# Patient Record
Sex: Female | Born: 1945
Health system: Southern US, Community
[De-identification: ages and names within clinical notes are randomized; demographics above are authoritative.]

## PROBLEM LIST (undated history)

## (undated) DIAGNOSIS — Z7189 Other specified counseling: Secondary | ICD-10-CM

## (undated) DIAGNOSIS — E119 Type 2 diabetes mellitus without complications: Secondary | ICD-10-CM

## (undated) DIAGNOSIS — C50912 Malignant neoplasm of unspecified site of left female breast: Secondary | ICD-10-CM

## (undated) HISTORY — DX: Other specified counseling: Z71.89

## (undated) HISTORY — DX: Malignant neoplasm of unspecified site of left female breast: C50.912

## (undated) HISTORY — PX: EYE SURGERY: SHX253

---

## 2009-12-30 ENCOUNTER — Inpatient Hospital Stay (HOSPITAL_COMMUNITY): Admission: EM | Admit: 2009-12-30 | Discharge: 2010-01-06 | Payer: Self-pay | Admitting: Emergency Medicine

## 2011-02-16 LAB — GLUCOSE, CAPILLARY
Glucose-Capillary: 113 mg/dL — ABNORMAL HIGH (ref 70–99)
Glucose-Capillary: 132 mg/dL — ABNORMAL HIGH (ref 70–99)
Glucose-Capillary: 148 mg/dL — ABNORMAL HIGH (ref 70–99)
Glucose-Capillary: 209 mg/dL — ABNORMAL HIGH (ref 70–99)
Glucose-Capillary: 250 mg/dL — ABNORMAL HIGH (ref 70–99)
Glucose-Capillary: 253 mg/dL — ABNORMAL HIGH (ref 70–99)

## 2011-02-16 LAB — URINALYSIS, ROUTINE W REFLEX MICROSCOPIC
Nitrite: NEGATIVE
Specific Gravity, Urine: 1.011 (ref 1.005–1.030)
Urobilinogen, UA: 1 mg/dL (ref 0.0–1.0)

## 2011-02-16 LAB — BASIC METABOLIC PANEL
BUN: 51 mg/dL — ABNORMAL HIGH (ref 6–23)
Calcium: 7.8 mg/dL — ABNORMAL LOW (ref 8.4–10.5)
Creatinine, Ser: 2.55 mg/dL — ABNORMAL HIGH (ref 0.4–1.2)
GFR calc Af Amer: 23 mL/min — ABNORMAL LOW (ref >60)

## 2011-02-16 LAB — CBC
HCT: 33.1 % — ABNORMAL LOW (ref 36.0–46.0)
Hemoglobin: 10.7 g/dL — ABNORMAL LOW (ref 12.0–15.0)
MCHC: 32.3 g/dL (ref 30.0–36.0)
MCV: 86 fL (ref 78.0–100.0)
Platelets: 187 10*3/uL (ref 150–400)
RBC: 3.86 MIL/uL — ABNORMAL LOW (ref 3.87–5.11)
RBC: 4.36 MIL/uL (ref 3.87–5.11)
RDW: 14.4 % (ref 11.5–15.5)
WBC: 14.9 10*3/uL — ABNORMAL HIGH (ref 4.0–10.5)

## 2011-02-16 LAB — COMPREHENSIVE METABOLIC PANEL
ALT: 14 U/L (ref 0–35)
AST: 23 U/L (ref 0–37)
Alkaline Phosphatase: 130 U/L — ABNORMAL HIGH (ref 39–117)
CO2: 17 mEq/L — ABNORMAL LOW (ref 19–32)
Calcium: 8.3 mg/dL — ABNORMAL LOW (ref 8.4–10.5)
Chloride: 106 mEq/L (ref 96–112)
GFR calc non Af Amer: 17 mL/min — ABNORMAL LOW (ref >60)
Potassium: 3.8 mEq/L (ref 3.5–5.1)
Sodium: 132 mEq/L — ABNORMAL LOW (ref 135–145)

## 2011-02-16 LAB — DIFFERENTIAL
Eosinophils Absolute: 0 10*3/uL (ref 0.0–0.7)
Lymphocytes Relative: 5 % — ABNORMAL LOW (ref 12–46)
Neutro Abs: 13.8 10*3/uL — ABNORMAL HIGH (ref 1.7–7.7)
Neutrophils Relative %: 92 % — ABNORMAL HIGH (ref 43–77)

## 2011-02-16 LAB — APTT: aPTT: 27 seconds (ref 24–37)

## 2011-02-16 LAB — URINE CULTURE

## 2011-02-16 LAB — CULTURE, BLOOD (ROUTINE X 2)

## 2011-02-16 LAB — LIPASE, BLOOD: Lipase: 14 U/L (ref 11–59)

## 2011-02-19 LAB — CK TOTAL AND CKMB (NOT AT ARMC)
CK, MB: 3.2 ng/mL (ref 0.3–4.0)
Total CK: 54 U/L (ref 7–177)

## 2011-02-19 LAB — BASIC METABOLIC PANEL
BUN: 22 mg/dL (ref 6–23)
BUN: 25 mg/dL — ABNORMAL HIGH (ref 6–23)
BUN: 32 mg/dL — ABNORMAL HIGH (ref 6–23)
BUN: 38 mg/dL — ABNORMAL HIGH (ref 6–23)
CO2: 14 mEq/L — ABNORMAL LOW (ref 19–32)
CO2: 15 mEq/L — ABNORMAL LOW (ref 19–32)
CO2: 20 mEq/L (ref 19–32)
Calcium: 7.6 mg/dL — ABNORMAL LOW (ref 8.4–10.5)
Calcium: 7.9 mg/dL — ABNORMAL LOW (ref 8.4–10.5)
Calcium: 8 mg/dL — ABNORMAL LOW (ref 8.4–10.5)
Chloride: 115 mEq/L — ABNORMAL HIGH (ref 96–112)
Chloride: 118 mEq/L — ABNORMAL HIGH (ref 96–112)
Chloride: 122 mEq/L — ABNORMAL HIGH (ref 96–112)
Creatinine, Ser: 1.89 mg/dL — ABNORMAL HIGH (ref 0.4–1.2)
Creatinine, Ser: 1.94 mg/dL — ABNORMAL HIGH (ref 0.4–1.2)
Creatinine, Ser: 2.14 mg/dL — ABNORMAL HIGH (ref 0.4–1.2)
GFR calc Af Amer: 28 mL/min — ABNORMAL LOW (ref >60)
GFR calc Af Amer: 33 mL/min — ABNORMAL LOW (ref >60)
GFR calc Af Amer: 37 mL/min — ABNORMAL LOW (ref >60)
GFR calc non Af Amer: 23 mL/min — ABNORMAL LOW (ref >60)
GFR calc non Af Amer: 25 mL/min — ABNORMAL LOW (ref >60)
GFR calc non Af Amer: 31 mL/min — ABNORMAL LOW (ref >60)
Glucose, Bld: 124 mg/dL — ABNORMAL HIGH (ref 70–99)
Glucose, Bld: 173 mg/dL — ABNORMAL HIGH (ref 70–99)
Potassium: 3.7 mEq/L (ref 3.5–5.1)
Potassium: 3.9 mEq/L (ref 3.5–5.1)
Sodium: 139 mEq/L (ref 135–145)
Sodium: 140 mEq/L (ref 135–145)

## 2011-02-19 LAB — HEPATIC FUNCTION PANEL
AST: 31 U/L (ref 0–37)
Albumin: 2.1 g/dL — ABNORMAL LOW (ref 3.5–5.2)
Total Bilirubin: 0.4 mg/dL (ref 0.3–1.2)
Total Protein: 6 g/dL (ref 6.0–8.3)

## 2011-02-19 LAB — GLUCOSE, CAPILLARY
Glucose-Capillary: 120 mg/dL — ABNORMAL HIGH (ref 70–99)
Glucose-Capillary: 131 mg/dL — ABNORMAL HIGH (ref 70–99)
Glucose-Capillary: 132 mg/dL — ABNORMAL HIGH (ref 70–99)
Glucose-Capillary: 133 mg/dL — ABNORMAL HIGH (ref 70–99)
Glucose-Capillary: 145 mg/dL — ABNORMAL HIGH (ref 70–99)
Glucose-Capillary: 147 mg/dL — ABNORMAL HIGH (ref 70–99)
Glucose-Capillary: 148 mg/dL — ABNORMAL HIGH (ref 70–99)
Glucose-Capillary: 154 mg/dL — ABNORMAL HIGH (ref 70–99)
Glucose-Capillary: 154 mg/dL — ABNORMAL HIGH (ref 70–99)
Glucose-Capillary: 161 mg/dL — ABNORMAL HIGH (ref 70–99)
Glucose-Capillary: 166 mg/dL — ABNORMAL HIGH (ref 70–99)
Glucose-Capillary: 208 mg/dL — ABNORMAL HIGH (ref 70–99)

## 2011-02-19 LAB — CBC
HCT: 28.2 % — ABNORMAL LOW (ref 36.0–46.0)
HCT: 32.8 % — ABNORMAL LOW (ref 36.0–46.0)
Hemoglobin: 10.5 g/dL — ABNORMAL LOW (ref 12.0–15.0)
Hemoglobin: 9.1 g/dL — ABNORMAL LOW (ref 12.0–15.0)
MCHC: 32.3 g/dL (ref 30.0–36.0)
MCHC: 32.5 g/dL (ref 30.0–36.0)
MCV: 86 fL (ref 78.0–100.0)
MCV: 86.3 fL (ref 78.0–100.0)
Platelets: 163 10*3/uL (ref 150–400)
Platelets: 200 10*3/uL (ref 150–400)
RBC: 3.68 MIL/uL — ABNORMAL LOW (ref 3.87–5.11)
RBC: 3.8 MIL/uL — ABNORMAL LOW (ref 3.87–5.11)
RDW: 14.3 % (ref 11.5–15.5)
WBC: 10.4 10*3/uL (ref 4.0–10.5)
WBC: 21.3 10*3/uL — ABNORMAL HIGH (ref 4.0–10.5)
WBC: 8.6 10*3/uL (ref 4.0–10.5)

## 2011-02-19 LAB — MAGNESIUM: Magnesium: 1.1 mg/dL — ABNORMAL LOW (ref 1.5–2.5)

## 2011-11-21 ENCOUNTER — Other Ambulatory Visit: Payer: Self-pay | Admitting: Cardiology

## 2012-02-25 ENCOUNTER — Other Ambulatory Visit: Payer: Self-pay | Admitting: Cardiology

## 2012-07-26 ENCOUNTER — Other Ambulatory Visit: Payer: Self-pay | Admitting: Cardiology

## 2012-10-06 ENCOUNTER — Emergency Department (HOSPITAL_COMMUNITY)
Admission: EM | Admit: 2012-10-06 | Discharge: 2012-10-07 | Disposition: A | Discharge status: Home / Self Care | Payer: PRIVATE HEALTH INSURANCE | Attending: Emergency Medicine | Admitting: Emergency Medicine

## 2012-10-06 ENCOUNTER — Encounter (HOSPITAL_COMMUNITY): Payer: Self-pay | Admitting: Emergency Medicine

## 2012-10-06 DIAGNOSIS — E162 Hypoglycemia, unspecified: Secondary | ICD-10-CM

## 2012-10-06 DIAGNOSIS — E1169 Type 2 diabetes mellitus with other specified complication: Secondary | ICD-10-CM | POA: Insufficient documentation

## 2012-10-06 DIAGNOSIS — Z79899 Other long term (current) drug therapy: Secondary | ICD-10-CM | POA: Insufficient documentation

## 2012-10-06 HISTORY — DX: Type 2 diabetes mellitus without complications: E11.9

## 2012-10-06 LAB — CBC WITH DIFFERENTIAL/PLATELET
Basophils Absolute: 0 K/uL (ref 0.0–0.1)
Basophils Relative: 0 % (ref 0–1)
Eosinophils Absolute: 0 K/uL (ref 0.0–0.7)
Eosinophils Relative: 0 % (ref 0–5)
HCT: 35.2 % — ABNORMAL LOW (ref 36.0–46.0)
Hemoglobin: 10.9 g/dL — ABNORMAL LOW (ref 12.0–15.0)
Lymphocytes Relative: 15 % (ref 12–46)
Lymphs Abs: 1.7 K/uL (ref 0.7–4.0)
MCH: 26.3 pg (ref 26.0–34.0)
MCHC: 31 g/dL (ref 30.0–36.0)
MCV: 85 fL (ref 78.0–100.0)
Monocytes Absolute: 0.7 K/uL (ref 0.1–1.0)
Monocytes Relative: 6 % (ref 3–12)
Neutro Abs: 8.4 K/uL — ABNORMAL HIGH (ref 1.7–7.7)
Neutrophils Relative %: 78 % — ABNORMAL HIGH (ref 43–77)
Platelets: 320 K/uL (ref 150–400)
RBC: 4.14 MIL/uL (ref 3.87–5.11)
RDW: 14.4 % (ref 11.5–15.5)
WBC: 10.7 K/uL — ABNORMAL HIGH (ref 4.0–10.5)

## 2012-10-06 LAB — BASIC METABOLIC PANEL
CO2: 17 mEq/L — ABNORMAL LOW (ref 19–32)
Calcium: 9.1 mg/dL (ref 8.4–10.5)
Creatinine, Ser: 2.7 mg/dL — ABNORMAL HIGH (ref 0.50–1.10)
Glucose, Bld: 69 mg/dL — ABNORMAL LOW (ref 70–99)
Sodium: 138 mEq/L (ref 135–145)

## 2012-10-06 LAB — URINALYSIS, ROUTINE W REFLEX MICROSCOPIC
Glucose, UA: NEGATIVE mg/dL
Protein, ur: 30 mg/dL — AB

## 2012-10-06 LAB — GLUCOSE, CAPILLARY
Glucose-Capillary: 87 mg/dL (ref 70–99)
Glucose-Capillary: 131 mg/dL — ABNORMAL HIGH (ref 70–99)

## 2012-10-06 LAB — URINE MICROSCOPIC-ADD ON

## 2012-10-06 MED ORDER — SODIUM CHLORIDE 0.9 % IV BOLUS (SEPSIS)
1000.0000 mL | Freq: Once | INTRAVENOUS | Status: AC
  Administered 2012-10-06: 1000 mL via INTRAVENOUS

## 2012-10-06 MED ORDER — GLUCOSE 40 % PO GEL
1.0000 | Freq: Once | ORAL | Status: DC
  Filled 2012-10-06: qty 3

## 2012-10-06 NOTE — ED Notes (Signed)
[facility] called to scene by patients mother. Unresponsive to EMS, agonal resp. Bradycardia. CBG 34. d10W given. Patient then became responsive. CBG repeat 150 Room air at this time NAD. Non insulin dependent.

## 2012-10-06 NOTE — ED Provider Notes (Signed)
Medical screening examination/treatment/procedure(s) were conducted as a shared visit with non-physician practitioner(s) and myself.  I personally evaluated the patient during the encounter  Pt with hypoglycemia.  Denies vomiting or diarrhea although she appears chronically ill and slightly dehydrated.  Will plan on admission considering her oral hypoglycemic agent use and worsening renal insufficiency.  Celene Kras, MD 10/06/12 [phone]

## 2012-10-06 NOTE — ED Notes (Signed)
Checked patient blood sugar it was 62 notified RN of blood sugar

## 2012-10-06 NOTE — ED Notes (Signed)
[facility] contacted for transport 

## 2012-10-06 NOTE — ED Provider Notes (Signed)
History     CSN: [id number]  Arrival date & time 10/06/12  1329   First MD Initiated Contact with Patient 10/06/12 1503      Chief Complaint  Patient presents with  . Blood Sugar Problem    (Consider location/radiation/quality/duration/timing/severity/associated sxs/prior treatment) HPI Comments: Patient brought in today via EMS after being called by patient's mother.  Patient's mother found the patient unresponsive and called EMS.  When EMS arrived at the home the patient was unresponsive and was found to have a CBG of 34.  She was then given 250 mL of D10W.  Patient then became responsive and repeat CBG was 150.  Patient denies any recent illness.  No fever or chills.  No nausea or vomiting.  No diarrhea.  No headache.  She is currently on Glimerpiride 2mg  daily.  No recent changes in medication dosage.  She states that she did not take the medication today.  She also states that she did not eat today.  She was admitted for similar hypoglycemia in the past.  She reports that her PCP is Dr. Sharyn Lull.    The history is provided by the patient.    Past Medical History  Diagnosis Date  . Diabetes mellitus without complication     No past surgical history on file.  No family history on file.  History  Substance Use Topics  . Smoking status: Not on file  . Smokeless tobacco: Not on file  . Alcohol Use:     OB History    Grav Para Term Preterm Abortions TAB SAB Ect Mult Living                  Review of Systems  Constitutional: Negative for fever and chills.  Eyes: Negative for visual disturbance.  Respiratory: Negative for shortness of breath.   Gastrointestinal: Negative for nausea, vomiting and abdominal pain.  Genitourinary: Negative for dysuria, urgency, frequency and decreased urine volume.  Neurological: Negative for dizziness, syncope, light-headedness and headaches.    Allergies  Review of patient's allergies indicates no known allergies.  Home Medications    Current Outpatient Rx  Name  Route  Sig  Dispense  Refill  . GLIMEPIRIDE 2 MG PO TABS   Oral   Take 2 mg by mouth daily before breakfast.         . NEBIVOLOL HCL 5 MG PO TABS   Oral   Take 5 mg by mouth daily.           BP 122/69  Pulse 63  Temp 98.6 F (37 C)  Resp 18  SpO2 98%  Physical Exam  Nursing note and vitals reviewed. Constitutional: She is oriented to person, place, and time. She appears well-developed and well-nourished. No distress.  HENT:  Head: Normocephalic and atraumatic.  Mouth/Throat: Oropharynx is clear and moist.  Neck: Normal range of motion. Neck supple.  Cardiovascular: Normal rate, regular rhythm and normal heart sounds.   Pulmonary/Chest: Effort normal and breath sounds normal.  Abdominal: Soft. Bowel sounds are normal. She exhibits no distension and no mass. There is no tenderness. There is no rebound and no guarding.  Neurological: She is alert and oriented to person, place, and time.  Skin: Skin is warm and dry. She is not diaphoretic.  Psychiatric: She has a normal mood and affect.    ED Course  Procedures (including critical care time)  Labs Reviewed - No data to display No results found.   No diagnosis found.  5:40 PM  Discussed with Dr. Sharyn Lull.  He recommends giving the patient IVF for her dehydration and discharging her home.  He reports that her Creatine at baseline is around 2.5.  He reports that he will see patient in the office tomorrow.  He also recommends holding her Glimepiride until she is seen in the office.  MDM  Patient presents today with a chief complaint of Hypoglycemia.  She was found unresponsive by EMS and found to have a CBG of 34 initially.  Blood sugar stable in the ED and patient able to eat.  However, BMP showed an elevated Creatine of 2.7.  Patient is on Glimepiride for her DM.  Discussed with Dr. Sharyn Lull.  He reports that the patient's Creatine is between 2.5 and 2.6 at baseline.  He recommends  discharging the patient home and having her follow up in the office tomorrow.  Discussed plan with patient who is in agreement with the plan.  Return precautions discussed.        Pascal Lux Alva, PA-C 10/06/12 1810

## 2012-10-06 NOTE — ED Notes (Signed)
Patient refusing Oral glucose, MD notified

## 2012-10-06 NOTE — ED Notes (Signed)
Pt given apple juice and sandwich with applesauce pr PAs request.

## 2012-10-09 LAB — URINE CULTURE

## 2012-10-10 NOTE — ED Notes (Signed)
+   Urine Chart sent to EDP office for review. 

## 2012-10-12 NOTE — ED Notes (Signed)
Chart returned from ED[facility] office  with rx written by Prescott Gum  For Keflex 500 mg BID x 10 days.

## 2012-10-15 ENCOUNTER — Telehealth (HOSPITAL_COMMUNITY): Payer: Self-pay | Admitting: Emergency Medicine

## 2012-10-15 NOTE — ED Notes (Signed)
Rx called in to [facility] on [address] by Shannon Gammons PFM. °

## 2016-08-23 ENCOUNTER — Emergency Department (HOSPITAL_COMMUNITY): Payer: Medicare Other

## 2016-08-23 ENCOUNTER — Inpatient Hospital Stay (HOSPITAL_COMMUNITY)
Admission: EM | Admit: 2016-08-23 | Discharge: 2016-09-01 | DRG: 987 | Disposition: A | Discharge status: Home / Self Care | Payer: Medicare Other | Attending: Cardiovascular Disease | Admitting: Cardiovascular Disease

## 2016-08-23 ENCOUNTER — Encounter (HOSPITAL_COMMUNITY): Payer: Self-pay | Admitting: Emergency Medicine

## 2016-08-23 DIAGNOSIS — C50412 Malignant neoplasm of upper-outer quadrant of left female breast: Secondary | ICD-10-CM | POA: Diagnosis present

## 2016-08-23 DIAGNOSIS — R590 Localized enlarged lymph nodes: Secondary | ICD-10-CM | POA: Diagnosis present

## 2016-08-23 DIAGNOSIS — R918 Other nonspecific abnormal finding of lung field: Secondary | ICD-10-CM | POA: Diagnosis not present

## 2016-08-23 DIAGNOSIS — R531 Weakness: Secondary | ICD-10-CM | POA: Diagnosis not present

## 2016-08-23 DIAGNOSIS — E43 Unspecified severe protein-calorie malnutrition: Secondary | ICD-10-CM | POA: Diagnosis present

## 2016-08-23 DIAGNOSIS — Z7982 Long term (current) use of aspirin: Secondary | ICD-10-CM

## 2016-08-23 DIAGNOSIS — N170 Acute kidney failure with tubular necrosis: Secondary | ICD-10-CM

## 2016-08-23 DIAGNOSIS — E876 Hypokalemia: Secondary | ICD-10-CM | POA: Diagnosis present

## 2016-08-23 DIAGNOSIS — C50919 Malignant neoplasm of unspecified site of unspecified female breast: Secondary | ICD-10-CM | POA: Diagnosis not present

## 2016-08-23 DIAGNOSIS — C7800 Secondary malignant neoplasm of unspecified lung: Secondary | ICD-10-CM | POA: Diagnosis present

## 2016-08-23 DIAGNOSIS — N179 Acute kidney failure, unspecified: Secondary | ICD-10-CM | POA: Diagnosis present

## 2016-08-23 DIAGNOSIS — D509 Iron deficiency anemia, unspecified: Secondary | ICD-10-CM | POA: Diagnosis present

## 2016-08-23 DIAGNOSIS — R64 Cachexia: Secondary | ICD-10-CM | POA: Diagnosis present

## 2016-08-23 DIAGNOSIS — B9620 Unspecified Escherichia coli [E. coli] as the cause of diseases classified elsewhere: Secondary | ICD-10-CM | POA: Diagnosis present

## 2016-08-23 DIAGNOSIS — E119 Type 2 diabetes mellitus without complications: Secondary | ICD-10-CM | POA: Diagnosis present

## 2016-08-23 DIAGNOSIS — N1339 Other hydronephrosis: Secondary | ICD-10-CM | POA: Diagnosis present

## 2016-08-23 DIAGNOSIS — E860 Dehydration: Secondary | ICD-10-CM | POA: Diagnosis present

## 2016-08-23 DIAGNOSIS — Z79899 Other long term (current) drug therapy: Secondary | ICD-10-CM | POA: Diagnosis not present

## 2016-08-23 DIAGNOSIS — Z681 Body mass index (BMI) 19 or less, adult: Secondary | ICD-10-CM | POA: Diagnosis not present

## 2016-08-23 DIAGNOSIS — E875 Hyperkalemia: Secondary | ICD-10-CM | POA: Diagnosis present

## 2016-08-23 DIAGNOSIS — N390 Urinary tract infection, site not specified: Secondary | ICD-10-CM | POA: Diagnosis present

## 2016-08-23 DIAGNOSIS — C7951 Secondary malignant neoplasm of bone: Secondary | ICD-10-CM | POA: Diagnosis not present

## 2016-08-23 DIAGNOSIS — D638 Anemia in other chronic diseases classified elsewhere: Secondary | ICD-10-CM | POA: Diagnosis present

## 2016-08-23 DIAGNOSIS — C799 Secondary malignant neoplasm of unspecified site: Secondary | ICD-10-CM

## 2016-08-23 DIAGNOSIS — I10 Essential (primary) hypertension: Secondary | ICD-10-CM | POA: Diagnosis present

## 2016-08-23 LAB — COMPREHENSIVE METABOLIC PANEL
ALBUMIN: 2.6 g/dL — AB (ref 3.5–5.0)
ALT: 7 U/L — AB (ref 14–54)
AST: 11 U/L — AB (ref 15–41)
Alkaline Phosphatase: 87 U/L (ref 38–126)
Anion gap: 15 (ref 5–15)
BUN: 106 mg/dL — AB (ref 6–20)
CHLORIDE: 115 mmol/L — AB (ref 101–111)
CO2: 7 mmol/L — AB (ref 22–32)
CREATININE: 4.14 mg/dL — AB (ref 0.44–1.00)
Calcium: 8.6 mg/dL — ABNORMAL LOW (ref 8.9–10.3)
GFR calc non Af Amer: 10 mL/min — ABNORMAL LOW (ref >60)
GFR, EST AFRICAN AMERICAN: 12 mL/min — AB (ref >60)
GLUCOSE: 181 mg/dL — AB (ref 65–99)
Potassium: 5.8 mmol/L — ABNORMAL HIGH (ref 3.5–5.1)
SODIUM: 137 mmol/L (ref 135–145)
Total Bilirubin: 1.5 mg/dL — ABNORMAL HIGH (ref 0.3–1.2)
Total Protein: 7.3 g/dL (ref 6.5–8.1)

## 2016-08-23 LAB — LIPASE, BLOOD: Lipase: 21 U/L (ref 11–51)

## 2016-08-23 LAB — CBC WITH DIFFERENTIAL/PLATELET
Basophils Absolute: 0 10*3/uL (ref 0.0–0.1)
Basophils Relative: 0 %
EOS ABS: 0 10*3/uL (ref 0.0–0.7)
Eosinophils Relative: 0 %
HCT: 34 % — ABNORMAL LOW (ref 36.0–46.0)
HEMOGLOBIN: 10.3 g/dL — AB (ref 12.0–15.0)
LYMPHS PCT: 31 %
Lymphs Abs: 1.9 10*3/uL (ref 0.7–4.0)
MCH: 26.8 pg (ref 26.0–34.0)
MCHC: 30.3 g/dL (ref 30.0–36.0)
MCV: 88.3 fL (ref 78.0–100.0)
MONO ABS: 0.4 10*3/uL (ref 0.1–1.0)
Monocytes Relative: 7 %
NEUTROS PCT: 62 %
Neutro Abs: 3.7 10*3/uL (ref 1.7–7.7)
PLATELETS: 80 10*3/uL — AB (ref 150–400)
RBC: 3.85 MIL/uL — AB (ref 3.87–5.11)
RDW: 17.4 % — ABNORMAL HIGH (ref 11.5–15.5)
WBC: 6 10*3/uL (ref 4.0–10.5)

## 2016-08-23 LAB — URINALYSIS, ROUTINE W REFLEX MICROSCOPIC
BILIRUBIN URINE: NEGATIVE
Glucose, UA: NEGATIVE mg/dL
KETONES UR: NEGATIVE mg/dL
NITRITE: NEGATIVE
Protein, ur: >300 mg/dL — AB
SPECIFIC GRAVITY, URINE: 1.017 (ref 1.005–1.030)
pH: 7.5 (ref 5.0–8.0)

## 2016-08-23 LAB — URINE MICROSCOPIC-ADD ON

## 2016-08-23 MED ORDER — ADULT MULTIVITAMIN W/MINERALS CH
1.0000 | ORAL_TABLET | Freq: Every day | ORAL | Status: DC
  Administered 2016-08-27 – 2016-08-30 (×3): 1 via ORAL
  Filled 2016-08-23 (×7): qty 1

## 2016-08-23 MED ORDER — ACETAMINOPHEN 650 MG RE SUPP: 650.0000 mg | Freq: Four times a day (QID) | RECTAL | Status: DC | PRN

## 2016-08-23 MED ORDER — CIPROFLOXACIN IN D5W 200 MG/100ML IV SOLN
200.0000 mg | INTRAVENOUS | Status: AC
  Administered 2016-08-24 – 2016-08-26 (×3): 200 mg via INTRAVENOUS
  Filled 2016-08-23 (×3): qty 100

## 2016-08-23 MED ORDER — SODIUM CHLORIDE 0.9 % IV BOLUS (SEPSIS)
1000.0000 mL | Freq: Once | INTRAVENOUS | Status: AC
  Administered 2016-08-23: 1000 mL via INTRAVENOUS

## 2016-08-23 MED ORDER — SODIUM CHLORIDE 0.9 % IV SOLN
INTRAVENOUS | Status: AC
  Administered 2016-08-23: via INTRAVENOUS
  Administered 2016-08-24: 1000 mL via INTRAVENOUS

## 2016-08-23 MED ORDER — ENOXAPARIN SODIUM 30 MG/0.3ML ~~LOC~~ SOLN
30.0000 mg | Freq: Every day | SUBCUTANEOUS | Status: DC
  Administered 2016-08-24 – 2016-08-31 (×8): 30 mg via SUBCUTANEOUS
  Filled 2016-08-23 (×7): qty 0.3

## 2016-08-23 MED ORDER — ACETAMINOPHEN 325 MG PO TABS
650.0000 mg | ORAL_TABLET | Freq: Four times a day (QID) | ORAL | Status: DC | PRN
  Administered 2016-08-27: 650 mg via ORAL
  Filled 2016-08-23: qty 2

## 2016-08-23 MED ORDER — DEXTROSE 5 % IV SOLN
1.0000 g | Freq: Once | INTRAVENOUS | Status: AC
  Administered 2016-08-23: 1 g via INTRAVENOUS
  Filled 2016-08-23: qty 10

## 2016-08-23 MED ORDER — DOCUSATE SODIUM 100 MG PO CAPS
100.0000 mg | ORAL_CAPSULE | Freq: Two times a day (BID) | ORAL | Status: DC
  Administered 2016-08-24 – 2016-08-31 (×6): 100 mg via ORAL
  Filled 2016-08-23 (×15): qty 1

## 2016-08-23 NOTE — ED Triage Notes (Signed)
Per [facility], pt from home c/o lower abdominal pain and burning with urination for an unknown amount of time. Denies N/V/D.

## 2016-08-23 NOTE — ED Notes (Signed)
MD at bedside. 

## 2016-08-23 NOTE — ED Notes (Signed)
No respiratory or acute distress noted alert and oriented states she wants to go home family at bedside no reaction to medication noted.

## 2016-08-23 NOTE — ED Notes (Signed)
No respiratory or acute distress noted alert and oriented family at bedside call light in reach swallow screen performed.

## 2016-08-23 NOTE — ED Notes (Signed)
Per pt cousin, pt has not eaten or walked in approximately one month. Pt cousin also reports concern of large "swelling" mass in patient's left breast.

## 2016-08-23 NOTE — ED Notes (Signed)
Patient does not want to be stuck again for blood draws.

## 2016-08-23 NOTE — ED Notes (Signed)
No respiratory or acute distress noted alert and oriented family at bedside call light in reach warm blankets given informed on wait time for CT scan.

## 2016-08-23 NOTE — H&P (Signed)
Referring Physician:  Saranya Harlin is an 70 y.o. female.                       Chief Complaint: Abdominal pain and urinary symptoms  HPI: 70 years old female with history of diabetes has lower abdominal pain and hurting on urination. She had also noticed lump in left breast as early as March, 2017. She claims she did not had money to see a doctor. 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 Left side of neck is also thick like a cord. Her renal function has also deteriorated with creatinine of 4.14. Her oral intake has been poor and she has not ambulated for long time per family.   Past Medical History:  Diagnosis Date  . Diabetes mellitus without complication ([place])       History reviewed. No pertinent surgical history.  History reviewed. No pertinent family history. Social History:  reports that she has never smoked. She has never used smokeless tobacco. Her alcohol and drug histories are not on file.  Allergies: No Known Allergies   (Not in a hospital admission)  Results for orders placed or performed during the hospital encounter of 08/23/16 (from the past 48 hour(s))  CBC with Differential     Status: Abnormal   Collection Time: 08/23/16  6:34 PM  Result Value Ref Range   WBC 6.0 4.0 - 10.5 K/uL   RBC 3.85 (L) 3.87 - 5.11 MIL/uL   Hemoglobin 10.3 (L) 12.0 - 15.0 g/dL   HCT 34.0 (L) 36.0 - 46.0 %   MCV 88.3 78.0 - 100.0 fL   MCH 26.8 26.0 - 34.0 pg   MCHC 30.3 30.0 - 36.0 g/dL   RDW 17.4 (H) 11.5 - 15.5 %   Platelets 80 (L) 150 - 400 K/uL    Comment: SPECIMEN CHECKED FOR CLOTS PLATELET COUNT CONFIRMED BY SMEAR    Neutrophils Relative % 62 %   Lymphocytes Relative 31 %   Monocytes Relative 7 %   Eosinophils Relative 0 %   Basophils Relative 0 %   Neutro Abs 3.7 1.7 - 7.7 K/uL   Lymphs Abs 1.9 0.7 - 4.0 K/uL   Monocytes Absolute 0.4 0.1 - 1.0 K/uL   Eosinophils Absolute 0.0  0.0 - 0.7 K/uL   Basophils Absolute 0.0 0.0 - 0.1 K/uL   RBC Morphology CRENATED RBCs   Urinalysis, Routine w reflex microscopic     Status: Abnormal   Collection Time: 08/23/16  7:45 PM  Result Value Ref Range   Color, Urine RED (A) YELLOW    Comment: BIOCHEMICALS MAY BE AFFECTED BY COLOR   APPearance TURBID (A) CLEAR   Specific Gravity, Urine 1.017 1.005 - 1.030   pH 7.5 5.0 - 8.0   Glucose, UA NEGATIVE NEGATIVE mg/dL   Hgb urine dipstick LARGE (A) NEGATIVE   Bilirubin Urine NEGATIVE NEGATIVE   Ketones, ur NEGATIVE NEGATIVE mg/dL   Protein, ur >300 (A) NEGATIVE mg/dL   Nitrite NEGATIVE NEGATIVE   Leukocytes, UA LARGE (A) NEGATIVE  Urine microscopic-add on     Status: Abnormal   Collection Time: 08/23/16  7:45 PM  Result Value Ref Range   Squamous Epithelial / LPF 0-5 (A) NONE SEEN   WBC, UA TOO NUMEROUS TO COUNT 0 - 5 WBC/hpf   RBC / HPF TOO NUMEROUS TO COUNT 0 - 5 RBC/hpf   Bacteria, UA  MANY (A) NONE SEEN   Urine-Other URINALYSIS PERFORMED ON SUPERNATANT     Comment: MICROSCOPIC EXAM PERFORMED ON UNCONCENTRATED URINE  Comprehensive metabolic panel     Status: Abnormal   Collection Time: 08/23/16  9:35 PM  Result Value Ref Range   Sodium 137 135 - 145 mmol/L   Potassium 5.8 (H) 3.5 - 5.1 mmol/L   Chloride 115 (H) 101 - 111 mmol/L   CO2 7 (L) 22 - 32 mmol/L   Glucose, Bld 181 (H) 65 - 99 mg/dL   BUN 106 (H) 6 - 20 mg/dL    Comment: RESULTS CONFIRMED BY MANUAL DILUTION   Creatinine, Ser 4.14 (H) 0.44 - 1.00 mg/dL   Calcium 8.6 (L) 8.9 - 10.3 mg/dL   Total Protein 7.3 6.5 - 8.1 g/dL   Albumin 2.6 (L) 3.5 - 5.0 g/dL   AST 11 (L) 15 - 41 U/L   ALT 7 (L) 14 - 54 U/L   Alkaline Phosphatase 87 38 - 126 U/L   Total Bilirubin 1.5 (H) 0.3 - 1.2 mg/dL   GFR calc non Af Amer 10 (L) >60 mL/min   GFR calc Af Amer 12 (L) >60 mL/min    Comment: (NOTE) The eGFR has been calculated using the CKD EPI equation. This calculation has not been validated in all clinical situations. eGFR's  persistently <60 mL/min signify possible Chronic Kidney Disease.    Anion gap 15 5 - 15  Lipase, blood     Status: None   Collection Time: 08/23/16  9:35 PM  Result Value Ref Range   Lipase 21 11 - 51 U/L   Ct Abdomen Pelvis Wo Contrast  Result Date: 08/23/2016 CLINICAL DATA:  Left breast mass.  Decreased appetite EXAM: CT CHEST, ABDOMEN AND PELVIS WITHOUT CONTRAST TECHNIQUE: Multidetector CT imaging of the chest, abdomen and pelvis was performed following the standard protocol without IV contrast. COMPARISON:  None. FINDINGS: CT CHEST FINDINGS Cardiovascular: There is no demonstrable thoracic aortic aneurysm. Visualized great vessels are quite tortuous with only modest atherosclerotic calcification at the origin of the left subclavian artery. Visualized great vessels otherwise appear within normal limits on this noncontrast enhanced study. There are foci of coronary artery calcification. Pericardium is not thickened. Mediastinum/Nodes: There is a 1 cm nodular lesion in the right lobe of the thyroid. There is a nearby 8 mm nodular lesion in the right lobe of the thyroid. These lesions are consistent with multinodular goiter. There is adenopathy in the left axilla measuring 2.0 x 1.8 cm. No other adenopathy is evident. Lungs/Pleura: There are multiple nodular lesions throughout the lungs ranging in size from as small as 5 mm to as large as 2 x 1.8 cm. This largest lesion is noted in the posterior segment of the left lower lobe. The largest lesion on the right is in the posterior segment of the right upper lobe measuring 1.7 x 1.4 cm. Nodular lesions are noted in virtually all segments of the lungs bilaterally and are consistent with widespread metastatic disease. Musculoskeletal: There is a mass arising from the left breast measuring 7.1 x 5.9 cm. This mass appears to invade the lateral chest wall musculature on the left. There is metastatic disease in multiple thoracic vertebral bodies. The largest lytic  lesion is noted in the T7 vertebral body anteriorly and toward the left. CT ABDOMEN PELVIS FINDINGS Hepatobiliary: No focal liver lesions are evident on this noncontrast enhanced study. There is porcelain gallbladder with calcification throughout the wall of the gallbladder. There are  probable calculi within the gallbladder as well. There is no pericholecystic fluid. There is no appreciable biliary duct dilatation. Pancreas: There is fatty replacement in portions of the pancreas. There is no pancreatic mass or inflammatory focus. Spleen: No splenic lesions are evident. Adrenals/Urinary Tract: Adrenals appear normal bilaterally. There is marked atrophy of the renal parenchyma on the right with diffuse hydronephrosis and surrounding mesenteric stranding. The wall of the renal pelvis on the right is thickened. There is ureterectasis diffusely on the right. No calculus is seen in the right kidney or ureter. On the left, no renal mass is evident. There is diffuse hydronephrosis in ureterectasis on the left with wall thickening of the left ureter diffusely. There is no renal calculus on the left. The urinary bladder shows diffuse wall thickening. Stomach/Bowel: The rectum is distended with stool. There is mild rectal wall thickening. There is mild perirectal soft tissue stranding. Elsewhere, there is no bowel wall thickening. No bowel obstruction. No free air or portal venous air. Vascular/Lymphatic: There is no abdominal aortic aneurysm. There are calcifications in proximal celiac and superior mesenteric arteries. There is also calcification in the internal and external iliac arteries and in the common femoral arteries bilaterally. There is no appreciable adenopathy in the abdomen or pelvis. Reproductive: Uterus is either extremely atrophic or absent. No pelvic mass evident. No free pelvic fluid. Other: Appendix appears normal. No ascites or abscess evident in the abdomen or pelvis. Musculoskeletal: There is a lytic  lesion in the inferior left iliac bone at the level of the left sacroiliac joint measuring 2.7 x 2.2 cm. There is no intramuscular or abdominal wall lesion. IMPRESSION: CT chest: Large mass arising from the left breast which invades the left chest wall musculature. Multiple pulmonary nodular opacities consistent with metastatic foci are noted throughout the lungs in virtually all segments bilaterally. There is left axillary adenopathy. There are multiple lytic bony metastases in the thoracic spine, largest at T7 on the left anteriorly. CT abdomen and pelvis: There is renal atrophy bilaterally, more marked on the right than on the left. There is extensive hydronephrosis and ureterectasis, more pronounced on the right than on the left. There is diffuse thickening of the wall of the ureter and collecting system on the right. Milder collecting system and ureteral wall thickening on the left noted. There is also thickening of the urinary bladder wall. The appearance in these areas is consistent with chronic inflammation. A well-defined mass is not seen. It should be noted that diffuse transitional cell carcinoma conceivably could present in this manner. Urologic consultation advised. Porcelain gallbladder with cholelithiasis. No bowel obstruction. No abscess. No ascites. No adenopathy in the abdomen or pelvis. Lytic lesion in the inferior left iliac bone. Diffuse stool in the rectum with mild rectal wall thickening. There may be a mild degree of stercoral colitis. There is mild stranding in the fat in the perirectal region. Multiple foci of mesenteric and pelvic arterial vascular atherosclerosis. Electronically Signed   By: Lowella Grip III M.D.   On: 08/23/2016 21:07   Ct Chest Wo Contrast  Result Date: 08/23/2016 CLINICAL DATA:  Left breast mass.  Decreased appetite EXAM: CT CHEST, ABDOMEN AND PELVIS WITHOUT CONTRAST TECHNIQUE: Multidetector CT imaging of the chest, abdomen and pelvis was performed following  the standard protocol without IV contrast. COMPARISON:  None. FINDINGS: CT CHEST FINDINGS Cardiovascular: There is no demonstrable thoracic aortic aneurysm. Visualized great vessels are quite tortuous with only modest atherosclerotic calcification at the origin of the left  subclavian artery. Visualized great vessels otherwise appear within normal limits on this noncontrast enhanced study. There are foci of coronary artery calcification. Pericardium is not thickened. Mediastinum/Nodes: There is a 1 cm nodular lesion in the right lobe of the thyroid. There is a nearby 8 mm nodular lesion in the right lobe of the thyroid. These lesions are consistent with multinodular goiter. There is adenopathy in the left axilla measuring 2.0 x 1.8 cm. No other adenopathy is evident. Lungs/Pleura: There are multiple nodular lesions throughout the lungs ranging in size from as small as 5 mm to as large as 2 x 1.8 cm. This largest lesion is noted in the posterior segment of the left lower lobe. The largest lesion on the right is in the posterior segment of the right upper lobe measuring 1.7 x 1.4 cm. Nodular lesions are noted in virtually all segments of the lungs bilaterally and are consistent with widespread metastatic disease. Musculoskeletal: There is a mass arising from the left breast measuring 7.1 x 5.9 cm. This mass appears to invade the lateral chest wall musculature on the left. There is metastatic disease in multiple thoracic vertebral bodies. The largest lytic lesion is noted in the T7 vertebral body anteriorly and toward the left. CT ABDOMEN PELVIS FINDINGS Hepatobiliary: No focal liver lesions are evident on this noncontrast enhanced study. There is porcelain gallbladder with calcification throughout the wall of the gallbladder. There are probable calculi within the gallbladder as well. There is no pericholecystic fluid. There is no appreciable biliary duct dilatation. Pancreas: There is fatty replacement in portions of  the pancreas. There is no pancreatic mass or inflammatory focus. Spleen: No splenic lesions are evident. Adrenals/Urinary Tract: Adrenals appear normal bilaterally. There is marked atrophy of the renal parenchyma on the right with diffuse hydronephrosis and surrounding mesenteric stranding. The wall of the renal pelvis on the right is thickened. There is ureterectasis diffusely on the right. No calculus is seen in the right kidney or ureter. On the left, no renal mass is evident. There is diffuse hydronephrosis in ureterectasis on the left with wall thickening of the left ureter diffusely. There is no renal calculus on the left. The urinary bladder shows diffuse wall thickening. Stomach/Bowel: The rectum is distended with stool. There is mild rectal wall thickening. There is mild perirectal soft tissue stranding. Elsewhere, there is no bowel wall thickening. No bowel obstruction. No free air or portal venous air. Vascular/Lymphatic: There is no abdominal aortic aneurysm. There are calcifications in proximal celiac and superior mesenteric arteries. There is also calcification in the internal and external iliac arteries and in the common femoral arteries bilaterally. There is no appreciable adenopathy in the abdomen or pelvis. Reproductive: Uterus is either extremely atrophic or absent. No pelvic mass evident. No free pelvic fluid. Other: Appendix appears normal. No ascites or abscess evident in the abdomen or pelvis. Musculoskeletal: There is a lytic lesion in the inferior left iliac bone at the level of the left sacroiliac joint measuring 2.7 x 2.2 cm. There is no intramuscular or abdominal wall lesion. IMPRESSION: CT chest: Large mass arising from the left breast which invades the left chest wall musculature. Multiple pulmonary nodular opacities consistent with metastatic foci are noted throughout the lungs in virtually all segments bilaterally. There is left axillary adenopathy. There are multiple lytic bony  metastases in the thoracic spine, largest at T7 on the left anteriorly. CT abdomen and pelvis: There is renal atrophy bilaterally, more marked on the right than on the left.  There is extensive hydronephrosis and ureterectasis, more pronounced on the right than on the left. There is diffuse thickening of the wall of the ureter and collecting system on the right. Milder collecting system and ureteral wall thickening on the left noted. There is also thickening of the urinary bladder wall. The appearance in these areas is consistent with chronic inflammation. A well-defined mass is not seen. It should be noted that diffuse transitional cell carcinoma conceivably could present in this manner. Urologic consultation advised. Porcelain gallbladder with cholelithiasis. No bowel obstruction. No abscess. No ascites. No adenopathy in the abdomen or pelvis. Lytic lesion in the inferior left iliac bone. Diffuse stool in the rectum with mild rectal wall thickening. There may be a mild degree of stercoral colitis. There is mild stranding in the fat in the perirectal region. Multiple foci of mesenteric and pelvic arterial vascular atherosclerosis. Electronically Signed   By: Lowella Grip III M.D.   On: 08/23/2016 21:07    Review Of Systems Constitutional: Negative for fever and chills. Eyes: Positive for left eye poor vision with hazy cornea. Respiratory: Negative for shortness of breath, asthma or COPD. Cardiovascular: Negative for chest pain, palpitation or leg edema. Gastrointestinal: Positive for abdominal pain, nausea and constipation. Genitourinary: Positive for dysuria, hematuria. Neurology: Negative for dizziness or headaches. Positive for syncope. Skin: No rash. Musculoskeletal: Positive arthritis.   Blood pressure 107/69, pulse 79, temperature 98.6 F (37 C), temperature source Oral, resp. rate 18, SpO2 96 %. General appearance: cooperative, appears older than stated age, cachectic and in mild  distress. Head: Normocephalic, atraumatic Eyes: conjunctivae pale pink, left cornea is opaque. Sclera: non-icteric. Neck: Left cervical lymph nodes are large and matted. No carotid bruit, no JVD, supple, trachea midline and thyroid not enlarged. Resp: clear to auscultation bilaterally. Left breast: large mass-6 x 7 cm in upper and outer quadrant with thickening of skin and inversion of left nipple. Cardio: regular rate and rhythm, S1, S2 normal, II/VI systolic murmur, no click, rub or gallop. GI: soft, non-tender; bowel sounds normal; no masses,  no organomegaly. Extremities: extremities normal, no cyanosis or edema Skin: Warm and dry. Lymph nodes: Palpable left Cervical and axillary nodes. Neurologic: Alert and oriented X 3, Decreased strength and normal tone. Normal coordination.Gait not checked due to weakness.  Assessment/Plan Acute renal failure Acute UTI Dehydration Large left breast mass Severe protein calorie malnutrition Diabetes mellitus, II Hyperkalemia  Admit. IV fluids. Oncology consult in AM if patient agrees.  Birdie Riddle, MD  08/23/2016, 11:16 PM

## 2016-08-23 NOTE — ED Provider Notes (Signed)
[name] DEPT Provider Note   CSN: [id number] Arrival date & time: 08/23/16  1659     History   Chief Complaint Chief Complaint  Patient presents with  . Abdominal Pain    HPI Becky Pena is a 70 y.o. female.  The history is provided by the patient and a relative. No language interpreter was used.  Abdominal Pain      Becky Pena is a 70 y.o. female who presents to the Emergency Department complaining of abdominal pain.  Level V caveat due to vague historian.  History is provided by the patient and her cousin.  Over the last month BeckyPena has had decreased oral intake and progressive generalized weakness, unable to walk for the last month.  She has experienced episodes of recurrent hypoglycemia that has been treated by EMS.  She usually refuses transfer to the hospital and does not like to get evaluated by the doctor.  She endorses lower abdominal pain for an unclear period of time.  No fevers, vomiting, sob.    Past Medical History:  Diagnosis Date  . Diabetes mellitus without complication [facility])     Patient Active Problem List   Diagnosis Date Noted  . Acute renal failure (ARF) ([place]) 08/23/2016    History reviewed. No pertinent surgical history.  OB History    No data available       Home Medications    Prior to Admission medications   Medication Sig Start Date End Date Taking? Authorizing Provider  aspirin 81 MG chewable tablet Chew by mouth daily.   Yes Historical Provider, MD  glimepiride (AMARYL) 2 MG tablet Take 2 mg by mouth daily before breakfast.   Yes Historical Provider, MD    Family History History reviewed. No pertinent family history.  Social History Social History  Substance Use Topics  . Smoking status: Never Smoker  . Smokeless tobacco: Never Used  . Alcohol use Not on file     Allergies   Review of patient's allergies indicates no known allergies.   Review of Systems Review of Systems  Gastrointestinal: Positive for  abdominal pain.  All other systems reviewed and are negative.    Physical Exam Updated Vital Signs BP 107/69 (BP Location: Left Arm)   Pulse 79   Temp 98.6 F (37 C) (Oral)   Resp 18   SpO2 96%   Physical Exam  Constitutional: She is oriented to person, place, and time. She appears well-developed.  Chronically ill appearing  HENT:  Head: Normocephalic and atraumatic.  Cardiovascular: Normal rate and regular rhythm.   No murmur heard. Pulmonary/Chest: Effort normal and breath sounds normal. No respiratory distress.  Large left breast mass that is firm and irregular  Abdominal: Soft. There is no rebound and no guarding.  Mild lower abdominal tenderness  Musculoskeletal: She exhibits no edema or tenderness.  Neurological: She is alert and oriented to person, place, and time.  Skin: Skin is warm and dry.  Psychiatric: She has a normal mood and affect. Her behavior is normal.  Nursing note and vitals reviewed.    ED Treatments / Results  Labs (all labs ordered are listed, but only abnormal results are displayed) Labs Reviewed  URINALYSIS, ROUTINE W REFLEX MICROSCOPIC (NOT AT [facility]) - Abnormal; Notable for the following:       Result Value   Color, Urine RED (*)    APPearance TURBID (*)    Hgb urine dipstick LARGE (*)    Protein, ur >300 (*)    Leukocytes,  UA LARGE (*)    All other components within normal limits  CBC WITH DIFFERENTIAL/PLATELET - Abnormal; Notable for the following:    RBC 3.85 (*)    Hemoglobin 10.3 (*)    HCT 34.0 (*)    RDW 17.4 (*)    Platelets 80 (*)    All other components within normal limits  COMPREHENSIVE METABOLIC PANEL - Abnormal; Notable for the following:    Potassium 5.8 (*)    Chloride 115 (*)    CO2 7 (*)    Glucose, Bld 181 (*)    BUN 106 (*)    Creatinine, Ser 4.14 (*)    Calcium 8.6 (*)    Albumin 2.6 (*)    AST 11 (*)    ALT 7 (*)    Total Bilirubin 1.5 (*)    GFR calc non Af Amer 10 (*)    GFR calc Af Amer 12 (*)    All  other components within normal limits  URINE MICROSCOPIC-ADD ON - Abnormal; Notable for the following:    Squamous Epithelial / LPF 0-5 (*)    Bacteria, UA MANY (*)    All other components within normal limits  URINE CULTURE  LIPASE, BLOOD  CBC  CREATININE, SERUM  BASIC METABOLIC PANEL  CBC    EKG  EKG Interpretation  Date/Time:  Sunday August 23 2016 18:04:13 EDT Ventricular Rate:  86 PR Interval:    QRS Duration: 91 QT Interval:  385 QTC Calculation: 461 R Axis:   -40 Text Interpretation:  Sinus rhythm Left anterior fascicular block Abnormal R-wave progression, early transition LVH by voltage Anterior Q waves, possibly due to LVH Confirmed by Hazle Coca [phone]) on 08/23/2016 7:13:04 PM       Radiology Ct Abdomen Pelvis Wo Contrast  Result Date: 08/23/2016 CLINICAL DATA:  Left breast mass.  Decreased appetite EXAM: CT CHEST, ABDOMEN AND PELVIS WITHOUT CONTRAST TECHNIQUE: Multidetector CT imaging of the chest, abdomen and pelvis was performed following the standard protocol without IV contrast. COMPARISON:  None. FINDINGS: CT CHEST FINDINGS Cardiovascular: There is no demonstrable thoracic aortic aneurysm. Visualized great vessels are quite tortuous with only modest atherosclerotic calcification at the origin of the left subclavian artery. Visualized great vessels otherwise appear within normal limits on this noncontrast enhanced study. There are foci of coronary artery calcification. Pericardium is not thickened. Mediastinum/Nodes: There is a 1 cm nodular lesion in the right lobe of the thyroid. There is a nearby 8 mm nodular lesion in the right lobe of the thyroid. These lesions are consistent with multinodular goiter. There is adenopathy in the left axilla measuring 2.0 x 1.8 cm. No other adenopathy is evident. Lungs/Pleura: There are multiple nodular lesions throughout the lungs ranging in size from as small as 5 mm to as large as 2 x 1.8 cm. This largest lesion is noted in the  posterior segment of the left lower lobe. The largest lesion on the right is in the posterior segment of the right upper lobe measuring 1.7 x 1.4 cm. Nodular lesions are noted in virtually all segments of the lungs bilaterally and are consistent with widespread metastatic disease. Musculoskeletal: There is a mass arising from the left breast measuring 7.1 x 5.9 cm. This mass appears to invade the lateral chest wall musculature on the left. There is metastatic disease in multiple thoracic vertebral bodies. The largest lytic lesion is noted in the T7 vertebral body anteriorly and toward the left. CT ABDOMEN PELVIS FINDINGS Hepatobiliary: No focal liver  lesions are evident on this noncontrast enhanced study. There is porcelain gallbladder with calcification throughout the wall of the gallbladder. There are probable calculi within the gallbladder as well. There is no pericholecystic fluid. There is no appreciable biliary duct dilatation. Pancreas: There is fatty replacement in portions of the pancreas. There is no pancreatic mass or inflammatory focus. Spleen: No splenic lesions are evident. Adrenals/Urinary Tract: Adrenals appear normal bilaterally. There is marked atrophy of the renal parenchyma on the right with diffuse hydronephrosis and surrounding mesenteric stranding. The wall of the renal pelvis on the right is thickened. There is ureterectasis diffusely on the right. No calculus is seen in the right kidney or ureter. On the left, no renal mass is evident. There is diffuse hydronephrosis in ureterectasis on the left with wall thickening of the left ureter diffusely. There is no renal calculus on the left. The urinary bladder shows diffuse wall thickening. Stomach/Bowel: The rectum is distended with stool. There is mild rectal wall thickening. There is mild perirectal soft tissue stranding. Elsewhere, there is no bowel wall thickening. No bowel obstruction. No free air or portal venous air. Vascular/Lymphatic:  There is no abdominal aortic aneurysm. There are calcifications in proximal celiac and superior mesenteric arteries. There is also calcification in the internal and external iliac arteries and in the common femoral arteries bilaterally. There is no appreciable adenopathy in the abdomen or pelvis. Reproductive: Uterus is either extremely atrophic or absent. No pelvic mass evident. No free pelvic fluid. Other: Appendix appears normal. No ascites or abscess evident in the abdomen or pelvis. Musculoskeletal: There is a lytic lesion in the inferior left iliac bone at the level of the left sacroiliac joint measuring 2.7 x 2.2 cm. There is no intramuscular or abdominal wall lesion. IMPRESSION: CT chest: Large mass arising from the left breast which invades the left chest wall musculature. Multiple pulmonary nodular opacities consistent with metastatic foci are noted throughout the lungs in virtually all segments bilaterally. There is left axillary adenopathy. There are multiple lytic bony metastases in the thoracic spine, largest at T7 on the left anteriorly. CT abdomen and pelvis: There is renal atrophy bilaterally, more marked on the right than on the left. There is extensive hydronephrosis and ureterectasis, more pronounced on the right than on the left. There is diffuse thickening of the wall of the ureter and collecting system on the right. Milder collecting system and ureteral wall thickening on the left noted. There is also thickening of the urinary bladder wall. The appearance in these areas is consistent with chronic inflammation. A well-defined mass is not seen. It should be noted that diffuse transitional cell carcinoma conceivably could present in this manner. Urologic consultation advised. Porcelain gallbladder with cholelithiasis. No bowel obstruction. No abscess. No ascites. No adenopathy in the abdomen or pelvis. Lytic lesion in the inferior left iliac bone. Diffuse stool in the rectum with mild rectal wall  thickening. There may be a mild degree of stercoral colitis. There is mild stranding in the fat in the perirectal region. Multiple foci of mesenteric and pelvic arterial vascular atherosclerosis. Electronically Signed   By: Lowella Grip III M.D.   On: 08/23/2016 21:07   Ct Chest Wo Contrast  Result Date: 08/23/2016 CLINICAL DATA:  Left breast mass.  Decreased appetite EXAM: CT CHEST, ABDOMEN AND PELVIS WITHOUT CONTRAST TECHNIQUE: Multidetector CT imaging of the chest, abdomen and pelvis was performed following the standard protocol without IV contrast. COMPARISON:  None. FINDINGS: CT CHEST FINDINGS Cardiovascular: There is  no demonstrable thoracic aortic aneurysm. Visualized great vessels are quite tortuous with only modest atherosclerotic calcification at the origin of the left subclavian artery. Visualized great vessels otherwise appear within normal limits on this noncontrast enhanced study. There are foci of coronary artery calcification. Pericardium is not thickened. Mediastinum/Nodes: There is a 1 cm nodular lesion in the right lobe of the thyroid. There is a nearby 8 mm nodular lesion in the right lobe of the thyroid. These lesions are consistent with multinodular goiter. There is adenopathy in the left axilla measuring 2.0 x 1.8 cm. No other adenopathy is evident. Lungs/Pleura: There are multiple nodular lesions throughout the lungs ranging in size from as small as 5 mm to as large as 2 x 1.8 cm. This largest lesion is noted in the posterior segment of the left lower lobe. The largest lesion on the right is in the posterior segment of the right upper lobe measuring 1.7 x 1.4 cm. Nodular lesions are noted in virtually all segments of the lungs bilaterally and are consistent with widespread metastatic disease. Musculoskeletal: There is a mass arising from the left breast measuring 7.1 x 5.9 cm. This mass appears to invade the lateral chest wall musculature on the left. There is metastatic disease in  multiple thoracic vertebral bodies. The largest lytic lesion is noted in the T7 vertebral body anteriorly and toward the left. CT ABDOMEN PELVIS FINDINGS Hepatobiliary: No focal liver lesions are evident on this noncontrast enhanced study. There is porcelain gallbladder with calcification throughout the wall of the gallbladder. There are probable calculi within the gallbladder as well. There is no pericholecystic fluid. There is no appreciable biliary duct dilatation. Pancreas: There is fatty replacement in portions of the pancreas. There is no pancreatic mass or inflammatory focus. Spleen: No splenic lesions are evident. Adrenals/Urinary Tract: Adrenals appear normal bilaterally. There is marked atrophy of the renal parenchyma on the right with diffuse hydronephrosis and surrounding mesenteric stranding. The wall of the renal pelvis on the right is thickened. There is ureterectasis diffusely on the right. No calculus is seen in the right kidney or ureter. On the left, no renal mass is evident. There is diffuse hydronephrosis in ureterectasis on the left with wall thickening of the left ureter diffusely. There is no renal calculus on the left. The urinary bladder shows diffuse wall thickening. Stomach/Bowel: The rectum is distended with stool. There is mild rectal wall thickening. There is mild perirectal soft tissue stranding. Elsewhere, there is no bowel wall thickening. No bowel obstruction. No free air or portal venous air. Vascular/Lymphatic: There is no abdominal aortic aneurysm. There are calcifications in proximal celiac and superior mesenteric arteries. There is also calcification in the internal and external iliac arteries and in the common femoral arteries bilaterally. There is no appreciable adenopathy in the abdomen or pelvis. Reproductive: Uterus is either extremely atrophic or absent. No pelvic mass evident. No free pelvic fluid. Other: Appendix appears normal. No ascites or abscess evident in the  abdomen or pelvis. Musculoskeletal: There is a lytic lesion in the inferior left iliac bone at the level of the left sacroiliac joint measuring 2.7 x 2.2 cm. There is no intramuscular or abdominal wall lesion. IMPRESSION: CT chest: Large mass arising from the left breast which invades the left chest wall musculature. Multiple pulmonary nodular opacities consistent with metastatic foci are noted throughout the lungs in virtually all segments bilaterally. There is left axillary adenopathy. There are multiple lytic bony metastases in the thoracic spine, largest at T7  on the left anteriorly. CT abdomen and pelvis: There is renal atrophy bilaterally, more marked on the right than on the left. There is extensive hydronephrosis and ureterectasis, more pronounced on the right than on the left. There is diffuse thickening of the wall of the ureter and collecting system on the right. Milder collecting system and ureteral wall thickening on the left noted. There is also thickening of the urinary bladder wall. The appearance in these areas is consistent with chronic inflammation. A well-defined mass is not seen. It should be noted that diffuse transitional cell carcinoma conceivably could present in this manner. Urologic consultation advised. Porcelain gallbladder with cholelithiasis. No bowel obstruction. No abscess. No ascites. No adenopathy in the abdomen or pelvis. Lytic lesion in the inferior left iliac bone. Diffuse stool in the rectum with mild rectal wall thickening. There may be a mild degree of stercoral colitis. There is mild stranding in the fat in the perirectal region. Multiple foci of mesenteric and pelvic arterial vascular atherosclerosis. Electronically Signed   By: Lowella Grip III M.D.   On: 08/23/2016 21:07    Procedures Procedures (including critical care time)  Medications Ordered in ED Medications  enoxaparin (LOVENOX) injection 30 mg (not administered)  0.9 %  sodium chloride infusion (  Intravenous New Bag/Given 08/23/16 2332)  acetaminophen (TYLENOL) tablet 650 mg (not administered)    Or  acetaminophen (TYLENOL) suppository 650 mg (not administered)  docusate sodium (COLACE) capsule 100 mg (not administered)  multivitamin with minerals tablet 1 tablet (not administered)  ciprofloxacin (CIPRO) IVPB 200 mg (not administered)  sodium chloride 0.9 % bolus 1,000 mL (0 mLs Intravenous Stopped 08/23/16 1925)  cefTRIAXone (ROCEPHIN) 1 g in dextrose 5 % 50 mL IVPB (0 g Intravenous Stopped 08/23/16 2253)     Initial Impression / Assessment and Plan / ED Course  I have reviewed the triage vital signs and the nursing notes.  Pertinent labs & imaging results that were available during my care of the patient were reviewed by me and considered in my medical decision making (see chart for details).  Clinical Course    Patient with history of diabetes here with decreased oral intake, lower abdominal pain. She is frail and chronically ill-appearing on examination with a large breast mass. Imaging and history is concerning for metastatic breast cancer. She has acute on chronic renal failure with hyperkalemia - treating with IVF. UA is concerning for urinary tract infection, will treat with antibiotics. Dr. Doylene Canard consulted for admission pending goals of care and treatment options.   Final Clinical Impressions(s) / ED Diagnoses   Final diagnoses:  Acute renal failure, unspecified acute renal failure type [facility])    New Prescriptions Current Discharge Medication List       Quintella Reichert, MD 08/24/16 [phone]

## 2016-08-23 NOTE — ED Notes (Signed)
Pt refuses in and out catheter.

## 2016-08-23 NOTE — ED Notes (Signed)
Pt currently refusing in and out catheter. Pt states "No I don't want that, it hurts".

## 2016-08-24 ENCOUNTER — Encounter (HOSPITAL_COMMUNITY): Payer: Self-pay

## 2016-08-24 DIAGNOSIS — N179 Acute kidney failure, unspecified: Principal | ICD-10-CM

## 2016-08-24 DIAGNOSIS — N390 Urinary tract infection, site not specified: Secondary | ICD-10-CM

## 2016-08-24 DIAGNOSIS — R918 Other nonspecific abnormal finding of lung field: Secondary | ICD-10-CM

## 2016-08-24 DIAGNOSIS — E875 Hyperkalemia: Secondary | ICD-10-CM

## 2016-08-24 DIAGNOSIS — C50919 Malignant neoplasm of unspecified site of unspecified female breast: Secondary | ICD-10-CM

## 2016-08-24 LAB — BASIC METABOLIC PANEL
BUN: 109 mg/dL — AB (ref 6–20)
CALCIUM: 8.4 mg/dL — AB (ref 8.9–10.3)
CO2: <7 mmol/L — ABNORMAL LOW (ref 22–32)
Chloride: 115 mmol/L — ABNORMAL HIGH (ref 101–111)
Creatinine, Ser: 4.12 mg/dL — ABNORMAL HIGH (ref 0.44–1.00)
GFR calc Af Amer: 12 mL/min — ABNORMAL LOW (ref >60)
GFR, EST NON AFRICAN AMERICAN: 10 mL/min — AB (ref >60)
GLUCOSE: 211 mg/dL — AB (ref 65–99)
Potassium: 5.9 mmol/L — ABNORMAL HIGH (ref 3.5–5.1)
SODIUM: 137 mmol/L (ref 135–145)

## 2016-08-24 LAB — CBC
HCT: 34.2 % — ABNORMAL LOW (ref 36.0–46.0)
Hemoglobin: 10.1 g/dL — ABNORMAL LOW (ref 12.0–15.0)
MCH: 26.1 pg (ref 26.0–34.0)
MCHC: 29.5 g/dL — AB (ref 30.0–36.0)
MCV: 88.4 fL (ref 78.0–100.0)
PLATELETS: 311 10*3/uL (ref 150–400)
RBC: 3.87 MIL/uL (ref 3.87–5.11)
RDW: 17.5 % — AB (ref 11.5–15.5)
WBC: 6 10*3/uL (ref 4.0–10.5)

## 2016-08-24 LAB — GLUCOSE, CAPILLARY
GLUCOSE-CAPILLARY: 141 mg/dL — AB (ref 65–99)
GLUCOSE-CAPILLARY: 107 mg/dL — AB (ref 65–99)
GLUCOSE-CAPILLARY: 97 mg/dL (ref 65–99)
GLUCOSE-CAPILLARY: 87 mg/dL (ref 65–99)

## 2016-08-24 MED ORDER — GLUCERNA SHAKE PO LIQD
237.0000 mL | Freq: Three times a day (TID) | ORAL | Status: DC
  Administered 2016-08-24 – 2016-08-30 (×8): 237 mL via ORAL
  Filled 2016-08-24 (×23): qty 237

## 2016-08-24 MED ORDER — ENSURE ENLIVE PO LIQD
237.0000 mL | Freq: Two times a day (BID) | ORAL | Status: DC
  Administered 2016-08-27 – 2016-08-30 (×2): 237 mL via ORAL

## 2016-08-24 MED ORDER — SODIUM POLYSTYRENE SULFONATE 15 GM/60ML PO SUSP
15.0000 g | Freq: Once | ORAL | Status: AC
  Administered 2016-08-24: 15 g via ORAL
  Filled 2016-08-24: qty 60

## 2016-08-24 MED ORDER — SODIUM BICARBONATE 650 MG PO TABS
650.0000 mg | ORAL_TABLET | Freq: Three times a day (TID) | ORAL | Status: DC
  Administered 2016-08-24 – 2016-09-01 (×15): 650 mg via ORAL
  Filled 2016-08-24 (×20): qty 1

## 2016-08-24 MED ORDER — SODIUM POLYSTYRENE SULFONATE 15 GM/60ML PO SUSP
30.0000 g | Freq: Once | ORAL | Status: DC
  Filled 2016-08-24: qty 120

## 2016-08-24 NOTE — Progress Notes (Signed)
Subjective:  Complains of pain and hypogastric region. Denies any chest pain or shortness of breath not seen in office for more than 4 years. States noticed lump in the left breast in March but did not seek any medical attention. Appetite remains poor.  Objective:  Vital Signs in the last 24 hours: Temp:  [97.3 F (36.3 C)-98.6 F (37 C)] 97.8 F (36.6 C) (09/25 0514) Pulse Rate:  [74-88] 77 (09/25 0514) Resp:  [16-22] 17 (09/25 0514) BP: (94-116)/(59-71) 104/68 (09/25 0514) SpO2:  [96 %-100 %] 100 % (09/25 0514) Weight:  [116 lb 6.4 oz (52.8 kg)] 116 lb 6.4 oz (52.8 kg) (09/25 0500)  Intake/Output from previous day: 09/24 0701 - 09/25 0700 In: 600 [I.V.:600] Out: 125 [Urine:125] Intake/Output from this shift: Total I/O In: -  Out: 150 [Urine:150]  Physical Exam: Conjunctiva pink Neck supple no JVD Lungs clear to auscultation anterolaterally Cardiovascular exam S1-S2 normal there is 2/6 systolic murmur noted Left breast mass noted Extremities no clubbing cyanosis or edema.  Lab Results:  Recent Labs  08/23/16 1834 08/24/16 0342  WBC 6.0 6.0  HGB 10.3* 10.1*  PLT 80* 311    Recent Labs  08/23/16 2135 08/24/16 0342  NA 137 137  K 5.8* 5.9*  CL 115* 115*  CO2 7* <7*  GLUCOSE 181* 211*  BUN 106* 109*  CREATININE 4.14* 4.12*   No results for input(s): TROPONINI in the last 72 hours.  Invalid input(s): CK, MB Hepatic Function Panel  Recent Labs  08/23/16 2135  PROT 7.3  ALBUMIN 2.6*  AST 11*  ALT 7*  ALKPHOS 87  BILITOT 1.5*   No results for input(s): CHOL in the last 72 hours. No results for input(s): PROTIME in the last 72 hours.  Imaging: Imaging results have been reviewed and Ct Abdomen Pelvis Wo Contrast  Result Date: 08/23/2016 CLINICAL DATA:  Left breast mass.  Decreased appetite EXAM: CT CHEST, ABDOMEN AND PELVIS WITHOUT CONTRAST TECHNIQUE: Multidetector CT imaging of the chest, abdomen and pelvis was performed following the standard  protocol without IV contrast. COMPARISON:  None. FINDINGS: CT CHEST FINDINGS Cardiovascular: There is no demonstrable thoracic aortic aneurysm. Visualized great vessels are quite tortuous with only modest atherosclerotic calcification at the origin of the left subclavian artery. Visualized great vessels otherwise appear within normal limits on this noncontrast enhanced study. There are foci of coronary artery calcification. Pericardium is not thickened. Mediastinum/Nodes: There is a 1 cm nodular lesion in the right lobe of the thyroid. There is a nearby 8 mm nodular lesion in the right lobe of the thyroid. These lesions are consistent with multinodular goiter. There is adenopathy in the left axilla measuring 2.0 x 1.8 cm. No other adenopathy is evident. Lungs/Pleura: There are multiple nodular lesions throughout the lungs ranging in size from as small as 5 mm to as large as 2 x 1.8 cm. This largest lesion is noted in the posterior segment of the left lower lobe. The largest lesion on the right is in the posterior segment of the right upper lobe measuring 1.7 x 1.4 cm. Nodular lesions are noted in virtually all segments of the lungs bilaterally and are consistent with widespread metastatic disease. Musculoskeletal: There is a mass arising from the left breast measuring 7.1 x 5.9 cm. This mass appears to invade the lateral chest wall musculature on the left. There is metastatic disease in multiple thoracic vertebral bodies. The largest lytic lesion is noted in the T7 vertebral body anteriorly and toward the left.  CT ABDOMEN PELVIS FINDINGS Hepatobiliary: No focal liver lesions are evident on this noncontrast enhanced study. There is porcelain gallbladder with calcification throughout the wall of the gallbladder. There are probable calculi within the gallbladder as well. There is no pericholecystic fluid. There is no appreciable biliary duct dilatation. Pancreas: There is fatty replacement in portions of the pancreas.  There is no pancreatic mass or inflammatory focus. Spleen: No splenic lesions are evident. Adrenals/Urinary Tract: Adrenals appear normal bilaterally. There is marked atrophy of the renal parenchyma on the right with diffuse hydronephrosis and surrounding mesenteric stranding. The wall of the renal pelvis on the right is thickened. There is ureterectasis diffusely on the right. No calculus is seen in the right kidney or ureter. On the left, no renal mass is evident. There is diffuse hydronephrosis in ureterectasis on the left with wall thickening of the left ureter diffusely. There is no renal calculus on the left. The urinary bladder shows diffuse wall thickening. Stomach/Bowel: The rectum is distended with stool. There is mild rectal wall thickening. There is mild perirectal soft tissue stranding. Elsewhere, there is no bowel wall thickening. No bowel obstruction. No free air or portal venous air. Vascular/Lymphatic: There is no abdominal aortic aneurysm. There are calcifications in proximal celiac and superior mesenteric arteries. There is also calcification in the internal and external iliac arteries and in the common femoral arteries bilaterally. There is no appreciable adenopathy in the abdomen or pelvis. Reproductive: Uterus is either extremely atrophic or absent. No pelvic mass evident. No free pelvic fluid. Other: Appendix appears normal. No ascites or abscess evident in the abdomen or pelvis. Musculoskeletal: There is a lytic lesion in the inferior left iliac bone at the level of the left sacroiliac joint measuring 2.7 x 2.2 cm. There is no intramuscular or abdominal wall lesion. IMPRESSION: CT chest: Large mass arising from the left breast which invades the left chest wall musculature. Multiple pulmonary nodular opacities consistent with metastatic foci are noted throughout the lungs in virtually all segments bilaterally. There is left axillary adenopathy. There are multiple lytic bony metastases in the  thoracic spine, largest at T7 on the left anteriorly. CT abdomen and pelvis: There is renal atrophy bilaterally, more marked on the right than on the left. There is extensive hydronephrosis and ureterectasis, more pronounced on the right than on the left. There is diffuse thickening of the wall of the ureter and collecting system on the right. Milder collecting system and ureteral wall thickening on the left noted. There is also thickening of the urinary bladder wall. The appearance in these areas is consistent with chronic inflammation. A well-defined mass is not seen. It should be noted that diffuse transitional cell carcinoma conceivably could present in this manner. Urologic consultation advised. Porcelain gallbladder with cholelithiasis. No bowel obstruction. No abscess. No ascites. No adenopathy in the abdomen or pelvis. Lytic lesion in the inferior left iliac bone. Diffuse stool in the rectum with mild rectal wall thickening. There may be a mild degree of stercoral colitis. There is mild stranding in the fat in the perirectal region. Multiple foci of mesenteric and pelvic arterial vascular atherosclerosis. Electronically Signed   By: Lowella Grip III M.D.   On: 08/23/2016 21:07   Ct Chest Wo Contrast  Result Date: 08/23/2016 CLINICAL DATA:  Left breast mass.  Decreased appetite EXAM: CT CHEST, ABDOMEN AND PELVIS WITHOUT CONTRAST TECHNIQUE: Multidetector CT imaging of the chest, abdomen and pelvis was performed following the standard protocol without IV contrast. COMPARISON:  None. FINDINGS: CT CHEST FINDINGS Cardiovascular: There is no demonstrable thoracic aortic aneurysm. Visualized great vessels are quite tortuous with only modest atherosclerotic calcification at the origin of the left subclavian artery. Visualized great vessels otherwise appear within normal limits on this noncontrast enhanced study. There are foci of coronary artery calcification. Pericardium is not thickened. Mediastinum/Nodes:  There is a 1 cm nodular lesion in the right lobe of the thyroid. There is a nearby 8 mm nodular lesion in the right lobe of the thyroid. These lesions are consistent with multinodular goiter. There is adenopathy in the left axilla measuring 2.0 x 1.8 cm. No other adenopathy is evident. Lungs/Pleura: There are multiple nodular lesions throughout the lungs ranging in size from as small as 5 mm to as large as 2 x 1.8 cm. This largest lesion is noted in the posterior segment of the left lower lobe. The largest lesion on the right is in the posterior segment of the right upper lobe measuring 1.7 x 1.4 cm. Nodular lesions are noted in virtually all segments of the lungs bilaterally and are consistent with widespread metastatic disease. Musculoskeletal: There is a mass arising from the left breast measuring 7.1 x 5.9 cm. This mass appears to invade the lateral chest wall musculature on the left. There is metastatic disease in multiple thoracic vertebral bodies. The largest lytic lesion is noted in the T7 vertebral body anteriorly and toward the left. CT ABDOMEN PELVIS FINDINGS Hepatobiliary: No focal liver lesions are evident on this noncontrast enhanced study. There is porcelain gallbladder with calcification throughout the wall of the gallbladder. There are probable calculi within the gallbladder as well. There is no pericholecystic fluid. There is no appreciable biliary duct dilatation. Pancreas: There is fatty replacement in portions of the pancreas. There is no pancreatic mass or inflammatory focus. Spleen: No splenic lesions are evident. Adrenals/Urinary Tract: Adrenals appear normal bilaterally. There is marked atrophy of the renal parenchyma on the right with diffuse hydronephrosis and surrounding mesenteric stranding. The wall of the renal pelvis on the right is thickened. There is ureterectasis diffusely on the right. No calculus is seen in the right kidney or ureter. On the left, no renal mass is evident. There  is diffuse hydronephrosis in ureterectasis on the left with wall thickening of the left ureter diffusely. There is no renal calculus on the left. The urinary bladder shows diffuse wall thickening. Stomach/Bowel: The rectum is distended with stool. There is mild rectal wall thickening. There is mild perirectal soft tissue stranding. Elsewhere, there is no bowel wall thickening. No bowel obstruction. No free air or portal venous air. Vascular/Lymphatic: There is no abdominal aortic aneurysm. There are calcifications in proximal celiac and superior mesenteric arteries. There is also calcification in the internal and external iliac arteries and in the common femoral arteries bilaterally. There is no appreciable adenopathy in the abdomen or pelvis. Reproductive: Uterus is either extremely atrophic or absent. No pelvic mass evident. No free pelvic fluid. Other: Appendix appears normal. No ascites or abscess evident in the abdomen or pelvis. Musculoskeletal: There is a lytic lesion in the inferior left iliac bone at the level of the left sacroiliac joint measuring 2.7 x 2.2 cm. There is no intramuscular or abdominal wall lesion. IMPRESSION: CT chest: Large mass arising from the left breast which invades the left chest wall musculature. Multiple pulmonary nodular opacities consistent with metastatic foci are noted throughout the lungs in virtually all segments bilaterally. There is left axillary adenopathy. There are multiple lytic bony  metastases in the thoracic spine, largest at T7 on the left anteriorly. CT abdomen and pelvis: There is renal atrophy bilaterally, more marked on the right than on the left. There is extensive hydronephrosis and ureterectasis, more pronounced on the right than on the left. There is diffuse thickening of the wall of the ureter and collecting system on the right. Milder collecting system and ureteral wall thickening on the left noted. There is also thickening of the urinary bladder wall. The  appearance in these areas is consistent with chronic inflammation. A well-defined mass is not seen. It should be noted that diffuse transitional cell carcinoma conceivably could present in this manner. Urologic consultation advised. Porcelain gallbladder with cholelithiasis. No bowel obstruction. No abscess. No ascites. No adenopathy in the abdomen or pelvis. Lytic lesion in the inferior left iliac bone. Diffuse stool in the rectum with mild rectal wall thickening. There may be a mild degree of stercoral colitis. There is mild stranding in the fat in the perirectal region. Multiple foci of mesenteric and pelvic arterial vascular atherosclerosis. Electronically Signed   By: Lowella Grip III M.D.   On: 08/23/2016 21:07    Cardiac Studies:  Assessment/Plan:  Metastatic CA of the left breast Acute renal failure Bilateral hydronephrosis questionable etiology UTI Dehydration Severe protein calorie malnutrition Diabetes mellitus Hypertension Hyperkalemia Plan Insert Foley catheter Check labs in a.m. Oncologic consult and urology consult called OT PT consult Kayexalate and bicarbonate as per orders   LOS: 1 day    Charolette Forward 08/24/2016, 12:59 PM

## 2016-08-24 NOTE — Consult Note (Signed)
New Urology Consult Note   Requesting Attending Physician:  Dixie Dials, MD Service Providing Consult: Urology Consulting Attending: Risa Grill, MD  Assessment:  Patient is a 70 y.o. female with history of poorly controlled diabetes, bilateral hydroureteronephrosis to the level of the bladder known since at least 2011 on urologic consultation, CT imaging showing left breast mass and lung and lytic lesions suggestive of metastatic disease who presented 9/24 with abdominal pain & dysuria and CT showing bilateral renal atrophy, bilateral hydroureteronephrosis and thickened bladder wall. 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 There is no evidence of external ureteral compression. Cr 4.1 from 2.7 in 2011.  Recommendations: 1. Follow up urine culture 9/24 2. Continue IVF resuscitation and IV antibiotic therapy, continue to trend Cr 3. Our recommendation would be Foley catheter insertion for decompression of the urinary system. We had a long and thoughtful discussion with the patient regarding this recommendation today. She wishes to defer catheter placement, whether it be Foley indwelling catheter or I&O catheterization. We'll continue to follow and revisit this discussion. If she were to change our mind our primary rec is to place Foley and leave in place for 48 hours 4. Would not be a good candidate for ureteral stenting as bladder is likely to be high pressure and would worsen upper tract deterioration  Thank you for this consult. Please contact the urology consult pager with any further questions/concerns. Sharmaine Base, MD Urology Surgical Resident  Addendum: I performed a history and physical examination of the patient and discussed his management with the resident.  I reviewed the resident's note and agree with the documented findings and plan of care. Becky Pena is clearly scared  and faces a number of significant medical decisions. She'll need the help of her family in deciding how aggressive to be with treatment. I suspect much of her acute renal failure is not recoverable given her atrophy and chronic hydronephrosis. Foley catheter may reduce in her bladder pressures and allow for improved renal function although unlikely again to return to normal limits. He is unlikely to benefit from [place] stenting at this time but that could be considered if she and her family elect aggressive therapy and her renal function/hydronephrosis does not improve with indwelling Foley catheter placement. Not much for urology service to do at this time until the patient/family make a decision about goals of care.   HPI -   Reason for Consult:  Hydronephrosis, renal insufficiency  Becky Pena is seen in consultation for reasons noted above at the request of Dixie Dials, MD.  This is a 70 yo patient with a history of DM and likely metastatic breast cancer, seen previously in urologic consultation by Dr. Diona Fanti in 2011 for hydronephrosis, with loss to follow up following no surgical or urologic intervention. She presented 9/24 with lower abdominal pain and dysuria.   She tells me that she came in due to 3 week history of lower abdominal pain & dysuria. No gross hematuria, urgency. + urinary frequency with typically q1hr voiding. Denies back pain, flank pain, fevers, n/v, diarrhea, constipation or history of recurrent UTI. She has had decreased appetite, poor sleeping stating that 'food just doesn't taste right'.   Her Cr on presentation was 4.14. WBC 6. Hb 10.3. U/a showed large leukocytes, negative nitrituria, TNTC WBC & RBC per HPF, with many bacteria. K 5.8 on admission. CT imaging suggestive of large breast mass, multiple pulmonary nodular opacities with  metastatic foci, multiple lytic bony mets in T spine, bilateral renal atrophy R > L, bilateral hydroureteronephrosis R > L, thickened bladder  wall. No abdominal lymphadenopathy noted.   US Abdomen Complete from 2012 showed severe bilateral hydronephrosis and hydroureter to the level of the bladder. CT imaging from 2012 also showed bilateral hydroureteronephrosis, right renal atrophy and prominent retroperitoneal lymph nodes.  Her Cr on 2011 urology consult was 2.79. She has a history of poorly controlled DM. She has a history of Foley catheterization with some fear due to previous discomfort.   Past Medical History: Past Medical History:  Diagnosis Date  . Diabetes mellitus without complication [facility])     Past Surgical History:  Past Surgical History:  Procedure Laterality Date  . EYE SURGERY     LEFT EYE     Medication: Current Facility-Administered Medications  Medication Dose Route Frequency Provider Last Rate Last Dose  . 0.9 %  sodium chloride infusion   Intravenous Continuous Dixie Dials, MD 100 mL/hr at 08/24/16 0050 1,000 mL at 08/24/16 0050  . acetaminophen (TYLENOL) tablet 650 mg  650 mg Oral Q6H PRN Dixie Dials, MD       Or  . acetaminophen (TYLENOL) suppository 650 mg  650 mg Rectal Q6H PRN Dixie Dials, MD      . ciprofloxacin (CIPRO) IVPB 200 mg  200 mg Intravenous Q24H Dixie Dials, MD   200 mg at 08/24/16 0103  . docusate sodium (COLACE) capsule 100 mg  100 mg Oral BID Dixie Dials, MD   100 mg at 08/24/16 0103  . enoxaparin (LOVENOX) injection 30 mg  30 mg Subcutaneous QHS Dixie Dials, MD   30 mg at 08/24/16 0103  . feeding supplement (ENSURE ENLIVE) (ENSURE ENLIVE) liquid 237 mL  237 mL Oral BID BM Dixie Dials, MD      . feeding supplement (GLUCERNA SHAKE) (GLUCERNA SHAKE) liquid 237 mL  237 mL Oral TID BM Dixie Dials, MD   237 mL at 08/24/16 1320  . multivitamin with minerals tablet 1 tablet  1 tablet Oral Daily Dixie Dials, MD      . sodium bicarbonate tablet 650 mg  650 mg Oral TID Charolette Forward, MD      . sodium polystyrene (KAYEXALATE) 15 GM/60ML suspension 30 g  30 g Oral Once Charolette Forward,  MD        Allergies: No Known Allergies  Social History: Social History  Substance Use Topics  . Smoking status: Never Smoker  . Smokeless tobacco: Never Used  . Alcohol use No    Family History History reviewed. No pertinent family history.  Review of Systems 10 systems were reviewed and are negative except as noted specifically in the HPI.  Objective   Vital signs in last 24 hours: BP 109/71 (BP Location: Left Arm)   Pulse 82   Temp 97.6 F (36.4 C) (Oral)   Resp 16   Ht 5\' 4"  (1.626 m)   Wt 116 lb 6.4 oz (52.8 kg)   SpO2 99%   BMI 19.98 kg/m   Intake/Output last 3 shifts: I/O last 3 completed shifts: In: 600 [I.V.:600] Out: 125 [Urine:125]  Physical Exam General: NAD, A&O, resting, appropriate, appears older than stated age and frail HEENT: [place], right EOMI, left eye appears surgically altered, MMM Pulmonary: Normal work of breathing on RA, large left chest wall mass palpated in anterior axillary line Cardiovascular: Regular rate & rhythm, HDS, adequate peripheral perfusion Abdomen: soft, TTP in suprapubic area, nondistended GU: voiding spontaneously,  no Foley catheter present, no CVA tenderness bilaterally Extremities: warm and well perfused, no edema  Most Recent Labs: Lab Results  Component Value Date   WBC 6.0 08/24/2016   HGB 10.1 (L) 08/24/2016   HCT 34.2 (L) 08/24/2016   PLT 311 08/24/2016    Lab Results  Component Value Date   NA 137 08/24/2016   K 5.9 (H) 08/24/2016   CL 115 (H) 08/24/2016   CO2 <7 (L) 08/24/2016   BUN 109 (H) 08/24/2016   CREATININE 4.12 (H) 08/24/2016   CALCIUM 8.4 (L) 08/24/2016   MG 1.1 (L) 01/05/2010    Lab Results  Component Value Date   ALKPHOS 87 08/23/2016   BILITOT 1.5 (H) 08/23/2016   BILIDIR 0.1 01/05/2010   PROT 7.3 08/23/2016   ALBUMIN 2.6 (L) 08/23/2016   ALT 7 (L) 08/23/2016   AST 11 (L) 08/23/2016    Lab Results  Component Value Date   INR 1.23 12/30/2009   APTT 27 12/30/2009     Urine  Culture: Pending   IMAGING: Ct Abdomen Pelvis Wo Contrast  Result Date: 08/23/2016 CLINICAL DATA:  Left breast mass.  Decreased appetite EXAM: CT CHEST, ABDOMEN AND PELVIS WITHOUT CONTRAST TECHNIQUE: Multidetector CT imaging of the chest, abdomen and pelvis was performed following the standard protocol without IV contrast. COMPARISON:  None. FINDINGS: CT CHEST FINDINGS Cardiovascular: There is no demonstrable thoracic aortic aneurysm. Visualized great vessels are quite tortuous with only modest atherosclerotic calcification at the origin of the left subclavian artery. Visualized great vessels otherwise appear within normal limits on this noncontrast enhanced study. There are foci of coronary artery calcification. Pericardium is not thickened. Mediastinum/Nodes: There is a 1 cm nodular lesion in the right lobe of the thyroid. There is a nearby 8 mm nodular lesion in the right lobe of the thyroid. These lesions are consistent with multinodular goiter. There is adenopathy in the left axilla measuring 2.0 x 1.8 cm. No other adenopathy is evident. Lungs/Pleura: There are multiple nodular lesions throughout the lungs ranging in size from as small as 5 mm to as large as 2 x 1.8 cm. This largest lesion is noted in the posterior segment of the left lower lobe. The largest lesion on the right is in the posterior segment of the right upper lobe measuring 1.7 x 1.4 cm. Nodular lesions are noted in virtually all segments of the lungs bilaterally and are consistent with widespread metastatic disease. Musculoskeletal: There is a mass arising from the left breast measuring 7.1 x 5.9 cm. This mass appears to invade the lateral chest wall musculature on the left. There is metastatic disease in multiple thoracic vertebral bodies. The largest lytic lesion is noted in the T7 vertebral body anteriorly and toward the left. CT ABDOMEN PELVIS FINDINGS Hepatobiliary: No focal liver lesions are evident on this noncontrast enhanced  study. There is porcelain gallbladder with calcification throughout the wall of the gallbladder. There are probable calculi within the gallbladder as well. There is no pericholecystic fluid. There is no appreciable biliary duct dilatation. Pancreas: There is fatty replacement in portions of the pancreas. There is no pancreatic mass or inflammatory focus. Spleen: No splenic lesions are evident. Adrenals/Urinary Tract: Adrenals appear normal bilaterally. There is marked atrophy of the renal parenchyma on the right with diffuse hydronephrosis and surrounding mesenteric stranding. The wall of the renal pelvis on the right is thickened. There is ureterectasis diffusely on the right. No calculus is seen in the right kidney or ureter. On the left,  no renal mass is evident. There is diffuse hydronephrosis in ureterectasis on the left with wall thickening of the left ureter diffusely. There is no renal calculus on the left. The urinary bladder shows diffuse wall thickening. Stomach/Bowel: The rectum is distended with stool. There is mild rectal wall thickening. There is mild perirectal soft tissue stranding. Elsewhere, there is no bowel wall thickening. No bowel obstruction. No free air or portal venous air. Vascular/Lymphatic: There is no abdominal aortic aneurysm. There are calcifications in proximal celiac and superior mesenteric arteries. There is also calcification in the internal and external iliac arteries and in the common femoral arteries bilaterally. There is no appreciable adenopathy in the abdomen or pelvis. Reproductive: Uterus is either extremely atrophic or absent. No pelvic mass evident. No free pelvic fluid. Other: Appendix appears normal. No ascites or abscess evident in the abdomen or pelvis. Musculoskeletal: There is a lytic lesion in the inferior left iliac bone at the level of the left sacroiliac joint measuring 2.7 x 2.2 cm. There is no intramuscular or abdominal wall lesion. IMPRESSION: CT chest:  Large mass arising from the left breast which invades the left chest wall musculature. Multiple pulmonary nodular opacities consistent with metastatic foci are noted throughout the lungs in virtually all segments bilaterally. There is left axillary adenopathy. There are multiple lytic bony metastases in the thoracic spine, largest at T7 on the left anteriorly. CT abdomen and pelvis: There is renal atrophy bilaterally, more marked on the right than on the left. There is extensive hydronephrosis and ureterectasis, more pronounced on the right than on the left. There is diffuse thickening of the wall of the ureter and collecting system on the right. Milder collecting system and ureteral wall thickening on the left noted. There is also thickening of the urinary bladder wall. The appearance in these areas is consistent with chronic inflammation. A well-defined mass is not seen. It should be noted that diffuse transitional cell carcinoma conceivably could present in this manner. Urologic consultation advised. Porcelain gallbladder with cholelithiasis. No bowel obstruction. No abscess. No ascites. No adenopathy in the abdomen or pelvis. Lytic lesion in the inferior left iliac bone. Diffuse stool in the rectum with mild rectal wall thickening. There may be a mild degree of stercoral colitis. There is mild stranding in the fat in the perirectal region. Multiple foci of mesenteric and pelvic arterial vascular atherosclerosis. Electronically Signed   By: Lowella Grip III M.D.   On: 08/23/2016 21:07   Ct Chest Wo Contrast  Result Date: 08/23/2016 CLINICAL DATA:  Left breast mass.  Decreased appetite EXAM: CT CHEST, ABDOMEN AND PELVIS WITHOUT CONTRAST TECHNIQUE: Multidetector CT imaging of the chest, abdomen and pelvis was performed following the standard protocol without IV contrast. COMPARISON:  None. FINDINGS: CT CHEST FINDINGS Cardiovascular: There is no demonstrable thoracic aortic aneurysm. Visualized great  vessels are quite tortuous with only modest atherosclerotic calcification at the origin of the left subclavian artery. Visualized great vessels otherwise appear within normal limits on this noncontrast enhanced study. There are foci of coronary artery calcification. Pericardium is not thickened. Mediastinum/Nodes: There is a 1 cm nodular lesion in the right lobe of the thyroid. There is a nearby 8 mm nodular lesion in the right lobe of the thyroid. These lesions are consistent with multinodular goiter. There is adenopathy in the left axilla measuring 2.0 x 1.8 cm. No other adenopathy is evident. Lungs/Pleura: There are multiple nodular lesions throughout the lungs ranging in size from as small as 5 mm  to as large as 2 x 1.8 cm. This largest lesion is noted in the posterior segment of the left lower lobe. The largest lesion on the right is in the posterior segment of the right upper lobe measuring 1.7 x 1.4 cm. Nodular lesions are noted in virtually all segments of the lungs bilaterally and are consistent with widespread metastatic disease. Musculoskeletal: There is a mass arising from the left breast measuring 7.1 x 5.9 cm. This mass appears to invade the lateral chest wall musculature on the left. There is metastatic disease in multiple thoracic vertebral bodies. The largest lytic lesion is noted in the T7 vertebral body anteriorly and toward the left. CT ABDOMEN PELVIS FINDINGS Hepatobiliary: No focal liver lesions are evident on this noncontrast enhanced study. There is porcelain gallbladder with calcification throughout the wall of the gallbladder. There are probable calculi within the gallbladder as well. There is no pericholecystic fluid. There is no appreciable biliary duct dilatation. Pancreas: There is fatty replacement in portions of the pancreas. There is no pancreatic mass or inflammatory focus. Spleen: No splenic lesions are evident. Adrenals/Urinary Tract: Adrenals appear normal bilaterally. There is  marked atrophy of the renal parenchyma on the right with diffuse hydronephrosis and surrounding mesenteric stranding. The wall of the renal pelvis on the right is thickened. There is ureterectasis diffusely on the right. No calculus is seen in the right kidney or ureter. On the left, no renal mass is evident. There is diffuse hydronephrosis in ureterectasis on the left with wall thickening of the left ureter diffusely. There is no renal calculus on the left. The urinary bladder shows diffuse wall thickening. Stomach/Bowel: The rectum is distended with stool. There is mild rectal wall thickening. There is mild perirectal soft tissue stranding. Elsewhere, there is no bowel wall thickening. No bowel obstruction. No free air or portal venous air. Vascular/Lymphatic: There is no abdominal aortic aneurysm. There are calcifications in proximal celiac and superior mesenteric arteries. There is also calcification in the internal and external iliac arteries and in the common femoral arteries bilaterally. There is no appreciable adenopathy in the abdomen or pelvis. Reproductive: Uterus is either extremely atrophic or absent. No pelvic mass evident. No free pelvic fluid. Other: Appendix appears normal. No ascites or abscess evident in the abdomen or pelvis. Musculoskeletal: There is a lytic lesion in the inferior left iliac bone at the level of the left sacroiliac joint measuring 2.7 x 2.2 cm. There is no intramuscular or abdominal wall lesion. IMPRESSION: CT chest: Large mass arising from the left breast which invades the left chest wall musculature. Multiple pulmonary nodular opacities consistent with metastatic foci are noted throughout the lungs in virtually all segments bilaterally. There is left axillary adenopathy. There are multiple lytic bony metastases in the thoracic spine, largest at T7 on the left anteriorly. CT abdomen and pelvis: There is renal atrophy bilaterally, more marked on the right than on the left. There  is extensive hydronephrosis and ureterectasis, more pronounced on the right than on the left. There is diffuse thickening of the wall of the ureter and collecting system on the right. Milder collecting system and ureteral wall thickening on the left noted. There is also thickening of the urinary bladder wall. The appearance in these areas is consistent with chronic inflammation. A well-defined mass is not seen. It should be noted that diffuse transitional cell carcinoma conceivably could present in this manner. Urologic consultation advised. Porcelain gallbladder with cholelithiasis. No bowel obstruction. No abscess. No ascites. No adenopathy  in the abdomen or pelvis. Lytic lesion in the inferior left iliac bone. Diffuse stool in the rectum with mild rectal wall thickening. There may be a mild degree of stercoral colitis. There is mild stranding in the fat in the perirectal region. Multiple foci of mesenteric and pelvic arterial vascular atherosclerosis. Electronically Signed   By: Lowella Grip III M.D.   On: 08/23/2016 21:07

## 2016-08-24 NOTE — Consult Note (Signed)
Becky Pena is a very nice 70 year old African-American female. She really has no medical care as far as I can tell. She's never had a mammogram.  She noticed a lump in the left breast back in March. She did not do anything regarding this. In the interim, his grown.  She was admitted on 9/24 with abdominal pain. She was found to have renal insufficiency. Her BUN was 106 and her creatinine was 4.14. Calcium was 8.6. Albumin 2.6. Potassium was 5.8.  His CT scan was done. This is done without contrast. She is found have multiple lesions in the lung. There is adenopathy in the left axilla patient 2 x 1.8 cm. She had a large mass arising in the left breast measuring 7.1 x 5.9 cm. She had lytic lesions in her bones. Largest was noted at T7. She has bilateral hydronephrosis. She has renal atrophy. She has lytic lesion in the inferior left iliac bone.  Her CBC which again showed a white cell count of 6. Hemoglobin 10.3 and platelet was 80,000. She had a repeat CBC today. Her platelet is 311,000.  She has normal liver function test. Her albumin was 2.6. Again her calcium was 8.4.  She's been seen by urology. She does not want have a Foley catheter placed.  She is not complaining of any obvious pain. She obviously is very weak. It looks like that she's lost weight.  She's had no cough.  Urinalysis that was done showed a possible urinary tract infection. Cultures are pending. She is on antibiotics.  She says that there is no one else with breast cancer in the family. She has 2 sons. Her first child was born she was 37. She does not recall when she went through the change of life.  She's never been on any kind of estrogens.  Overall, I would say that her performance status currently is ECOG 3.  On her physical exam, her vital signs are temperature of 97.6. Pulse 82. Blood pressure 109/71. Head exam shows her left eye to be shut. This is chronic. There is no oral lesions. She has no adenopathy in the  neck. Lungs are relatively clear bilaterally. Cardiac exam regular rate and rhythm with no murmurs, rubs or bruits. Abdomen is soft. She has good bowel sounds. There is no fluid wave. There is no palpable liver or spleen tip. Breast exam shows right breast no masses, edema or erythema. There is no right axillary adenopathy. Blood pressure is a large firm non-mobile breast mass in the upper outer quadrant of the left breast. This is nontender. She may have some slight fullness in the left axilla. Extremities shows no clubbing, cyanosis or edema. Neurological exam shows no focal neurological deficits.   Overall, Becky Pena has metastatic breast cancer. The problem that we have is that we need to have a biopsy. The left breast mass would easily be biopsied. However, I'm not sure of this can be done with her as an inpatient. If not, then maybe the left axilla, which has an enlarged lymph node. This may be able to be biopsied.  She obviously is very scared. She really has not had much the way of medical care.  Given her age, I would have to think that her breast cancer would be estrogen positive. I would expect that it would also be HER-2 negative. These are very very important for [place] to be able to determine.  I think a bone scan would help [place] out. I think she probably also  needs MRI of the brain to make sure nothing else is going on up into the CNS.  She clearly has a very strong faith. We shared a prayer. I asked her if I could pray with her. She is very thankful that I have a strong belief.  We will follow along.  I'm not sure much we will be able to accomplish as long as she has renal renal failure with the hyperkalemia.  I spent about an hour with her. She is very very nice. Hopefully, we will be able to help her out.  For right now, I do not see a role for any kind of surgery with respect to palliative mastectomy.  Becky Haw, MD  Ex[place] 15:2

## 2016-08-24 NOTE — Progress Notes (Signed)
Inpatient Diabetes Program Recommendations  AACE/ADA: New Consensus Statement on Inpatient Glycemic Control (2015)  Target Ranges:  Prepandial:   less than 140 mg/dL      Peak postprandial:   less than 180 mg/dL (1-2 hours)      Critically ill patients:  140 - 180 mg/dL    Admit with: Acute Renal Failure/ UTI/ Breast Mass  History: DM2  Home DM Meds: Amaryl 2 mg daily  Current Insulin Orders: None     MD- Please consider the following:  1. Start Novolog Sensitive Correction Scale/ SSI (0-9 units) TID AC + HS  2. Check current A1c level  3. Change diet orders to Carbohydrate Modified diet (currently ordered as Regular diet)     --Will follow patient during hospitalization--  Wyn Quaker RN, MSN, CDE Diabetes Coordinator Inpatient Glycemic Control Team Team Pager: [phone] (8a-5p)

## 2016-08-24 NOTE — Progress Notes (Signed)
Received report from [place] in ED at 2345.Pt arrived via stretcher at [place].Pt awake,alert, and accompanied by her cousin who will be spending the night.T-97.3 p-74 r-17 Bp-103/60 PO2 100% on r/a.Pt resting quietly in bed and denies pain at this time.Pt admit w/ c/o pain upon voiding and abd pain.Pt has obvious firm,masslike area to base of L breast.Pt has h/o cataract in L eye and eye stays closed.Pt oriented to person,place and situation and able to follow simple commands w. Generalized weakness.Bed low,locked and call bell in reach.Pt and cousin oriented to room and call bell.Pt resting quietly in bed.wbb

## 2016-08-24 NOTE — Progress Notes (Signed)
Initial Nutrition Assessment  DOCUMENTATION CODES:   Severe malnutrition in context of chronic illness  INTERVENTION:   Provide Glucerna Shake po TID, each supplement provides 220 kcal and 10 grams of protein Encourage PO intake  RD to continue to monitor  NUTRITION DIAGNOSIS:   Malnutrition related to chronic illness as evidenced by energy intake < or equal to 75% for > or equal to 1 month, severe depletion of body fat, severe depletion of muscle mass.  GOAL:   Patient will meet greater than or equal to 90% of their needs  MONITOR:   PO intake, Supplement acceptance, Labs, Weight trends, Skin, I & O's  REASON FOR ASSESSMENT:   Low Braden    ASSESSMENT:   70 years old female with history of diabetes has lower abdominal pain and hurting on urination. She had also noticed lump in left breast as early as March, 2017. She claims she did not had money to see a doctor. 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 Left side of neck is also thick like a cord. Her renal function has also deteriorated with creatinine of 4.14. Her oral intake has been poor and she has not ambulated for long time per family.   Patient in room with no family at bedside. Pt provided very limited history. Pt states she has had poor appetite "for the longest time" and "haven't felt like eating". Pt with noticeable poor dentition. Pt on regular diet. Noted DM coordinator note, recommending CHO modified diet. Uncertain if pt will consume enough of meals for CHO modified diet to be helpful at this time. Will order Glucerna shakes.  Pt with breast mass, which has been left untreated. Pt unable to provide UBW but states she thinks she weighed more than 116 lb (which is current weight).  Nutrition-Focused physical exam completed. Findings are severe fat depletion, severe muscle depletion, and no edema.   Medications: Colace  capsule BID, Multivitamin with minerals daily Labs reviewed: CBGs: 141 Elevated K, BUN Glucose: 211  Diet Order:  Diet regular Room service appropriate? Yes; Fluid consistency: Thin  Skin:  Reviewed, no issues  Last BM:  9/23  Height:   Ht Readings from Last 1 Encounters:  08/24/16 5\' 4"  (1.626 m)    Weight:   Wt Readings from Last 1 Encounters:  08/24/16 116 lb 6.4 oz (52.8 kg)    Ideal Body Weight:  54.5 kg  BMI:  Body mass index is 19.98 kg/m.  Estimated Nutritional Needs:   Kcal:  1350-1550  Protein:  60-70g  Fluid:  1.5L/day  EDUCATION NEEDS:   No education needs identified at this time  Clayton Bibles, MS, RD, LDN Pager: [phone] After Hours Pager: [phone]

## 2016-08-24 NOTE — Progress Notes (Signed)
Nursing Note: Output of urine has been 125 cc since pt arrived  At 0010.wbb

## 2016-08-25 ENCOUNTER — Inpatient Hospital Stay (HOSPITAL_COMMUNITY): Payer: Medicare Other

## 2016-08-25 LAB — CBC
HCT: 25.8 % — ABNORMAL LOW (ref 36.0–46.0)
Hemoglobin: 8 g/dL — ABNORMAL LOW (ref 12.0–15.0)
MCH: 26.9 pg (ref 26.0–34.0)
MCHC: 31 g/dL (ref 30.0–36.0)
MCV: 86.9 fL (ref 78.0–100.0)
PLATELETS: 262 10*3/uL (ref 150–400)
RBC: 2.97 MIL/uL — AB (ref 3.87–5.11)
RDW: 17.6 % — AB (ref 11.5–15.5)
WBC: 4.3 10*3/uL (ref 4.0–10.5)

## 2016-08-25 LAB — BASIC METABOLIC PANEL
ANION GAP: 8 (ref 5–15)
BUN: 93 mg/dL — AB (ref 6–20)
CHLORIDE: 122 mmol/L — AB (ref 101–111)
CO2: 11 mmol/L — AB (ref 22–32)
CREATININE: 3.56 mg/dL — AB (ref 0.44–1.00)
Calcium: 8.1 mg/dL — ABNORMAL LOW (ref 8.9–10.3)
GFR calc non Af Amer: 12 mL/min — ABNORMAL LOW (ref >60)
GFR, EST AFRICAN AMERICAN: 14 mL/min — AB (ref >60)
GLUCOSE: 74 mg/dL (ref 65–99)
Potassium: 4 mmol/L (ref 3.5–5.1)
SODIUM: 141 mmol/L (ref 135–145)

## 2016-08-25 LAB — GLUCOSE, CAPILLARY
GLUCOSE-CAPILLARY: 88 mg/dL (ref 65–99)
GLUCOSE-CAPILLARY: 90 mg/dL (ref 65–99)
Glucose-Capillary: 74 mg/dL (ref 65–99)
Glucose-Capillary: 95 mg/dL (ref 65–99)

## 2016-08-25 LAB — CANCER ANTIGEN 27.29: CA 27.29: 346.3 U/mL — AB (ref 0.0–38.6)

## 2016-08-25 MED ORDER — TECHNETIUM TC 99M MEDRONATE IV KIT
20.1000 | PACK | Freq: Once | INTRAVENOUS | Status: AC | PRN
  Administered 2016-08-25: 20.1 via INTRAVENOUS

## 2016-08-25 MED ORDER — SODIUM CHLORIDE 0.45 % IV SOLN
INTRAVENOUS | Status: DC
  Administered 2016-08-25: 18:00:00 via INTRAVENOUS
  Administered 2016-08-25: 1000 mL via INTRAVENOUS
  Administered 2016-08-26: 75 mL/h via INTRAVENOUS

## 2016-08-25 NOTE — Consult Note (Signed)
Urology Consult Note   Requesting Attending Physician:  Dixie Dials, MD Service Providing Consult: Urology Consulting Attending: Risa Grill, MD  Assessment:  Patient is a 70 y.o. female with history of poorly controlled diabetes, bilateral hydroureteronephrosis to the level of the bladder known since at least 2011 on urologic consultation, CT imaging showing left breast mass and lung and lytic lesions suggestive of metastatic disease who presented 9/24 with abdominal pain & dysuria and CT showing bilateral renal atrophy, bilateral hydroureteronephrosis and thickened bladder wall. 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 There is no evidence of external ureteral compression. Cr 4.1 from 2.7 in 2011.  Interval: After further thought patient agreed to move forward with Foley catheter placement, placed last evening. Cr 3.5 from 4.1. Preliminary urine culture showing >100k CFU/mL E coli (previously pan-sensitive from 2013 culture). AFVSS.   Recommendations: 1. Follow up final urine culture 9/24 sensitivities 2. Continue IVF resuscitation and IV antibiotic therapy, continue to trend Cr 3. Continue Foley for at least 48 hours  Thank you for this consult. Please contact the urology consult pager with any further questions/concerns. Sharmaine Base, MD Urology Surgical Resident  Subjective Doing well. No pain. Not much discomfort from Foley. No fever. No n/v. No back/flank pain. Decreased appetite.  Objective   Vital signs in last 24 hours: BP 109/69 (BP Location: Left Arm)   Pulse 73   Temp 97.8 F (36.6 C) (Oral)   Resp 16   Ht 5\' 4"  (1.626 m)   Wt 116 lb 6.4 oz (52.8 kg)   SpO2 100%   BMI 19.98 kg/m   Intake/Output last 3 shifts: I/O last 3 completed shifts: In: 1956.7 [P.O.:440; I.V.:1516.7] Out: 865 [Urine:865]  Physical Exam General: NAD, A&O, resting, appropriate,  appears older than stated age and frail HEENT: [place], right EOMI, left eye appears surgically altered, MMM Pulmonary: Normal work of breathing on RA, large left chest wall mass palpated in anterior axillary line Cardiovascular: Regular rate & rhythm, HDS, adequate peripheral perfusion Abdomen: soft, NTTP nondistended GU: Foley catheter draining concentrated urine with minor red sediment, no CVA tenderness bilaterally Extremities: warm and well perfused, no edema  Most Recent Labs: Lab Results  Component Value Date   WBC 4.3 08/25/2016   HGB 8.0 (L) 08/25/2016   HCT 25.8 (L) 08/25/2016   PLT 262 08/25/2016    Lab Results  Component Value Date   NA 141 08/25/2016   K 4.0 08/25/2016   CL 122 (H) 08/25/2016   CO2 11 (L) 08/25/2016   BUN 93 (H) 08/25/2016   CREATININE 3.56 (H) 08/25/2016   CALCIUM 8.1 (L) 08/25/2016   MG 1.1 (L) 01/05/2010    Lab Results  Component Value Date   ALKPHOS 87 08/23/2016   BILITOT 1.5 (H) 08/23/2016   BILIDIR 0.1 01/05/2010   PROT 7.3 08/23/2016   ALBUMIN 2.6 (L) 08/23/2016   ALT 7 (L) 08/23/2016   AST 11 (L) 08/23/2016    Lab Results  Component Value Date   INR 1.23 12/30/2009   APTT 27 12/30/2009     Urine Culture: Pending   IMAGING: Ct Abdomen Pelvis Wo Contrast  Result Date: 08/23/2016 CLINICAL DATA:  Left breast mass.  Decreased appetite EXAM: CT CHEST, ABDOMEN AND PELVIS WITHOUT CONTRAST TECHNIQUE: Multidetector CT imaging of the chest, abdomen and pelvis was performed following the standard protocol without IV contrast. COMPARISON:  None. FINDINGS: CT CHEST FINDINGS Cardiovascular: There is no  demonstrable thoracic aortic aneurysm. Visualized great vessels are quite tortuous with only modest atherosclerotic calcification at the origin of the left subclavian artery. Visualized great vessels otherwise appear within normal limits on this noncontrast enhanced study. There are foci of coronary artery calcification. Pericardium is not  thickened. Mediastinum/Nodes: There is a 1 cm nodular lesion in the right lobe of the thyroid. There is a nearby 8 mm nodular lesion in the right lobe of the thyroid. These lesions are consistent with multinodular goiter. There is adenopathy in the left axilla measuring 2.0 x 1.8 cm. No other adenopathy is evident. Lungs/Pleura: There are multiple nodular lesions throughout the lungs ranging in size from as small as 5 mm to as large as 2 x 1.8 cm. This largest lesion is noted in the posterior segment of the left lower lobe. The largest lesion on the right is in the posterior segment of the right upper lobe measuring 1.7 x 1.4 cm. Nodular lesions are noted in virtually all segments of the lungs bilaterally and are consistent with widespread metastatic disease. Musculoskeletal: There is a mass arising from the left breast measuring 7.1 x 5.9 cm. This mass appears to invade the lateral chest wall musculature on the left. There is metastatic disease in multiple thoracic vertebral bodies. The largest lytic lesion is noted in the T7 vertebral body anteriorly and toward the left. CT ABDOMEN PELVIS FINDINGS Hepatobiliary: No focal liver lesions are evident on this noncontrast enhanced study. There is porcelain gallbladder with calcification throughout the wall of the gallbladder. There are probable calculi within the gallbladder as well. There is no pericholecystic fluid. There is no appreciable biliary duct dilatation. Pancreas: There is fatty replacement in portions of the pancreas. There is no pancreatic mass or inflammatory focus. Spleen: No splenic lesions are evident. Adrenals/Urinary Tract: Adrenals appear normal bilaterally. There is marked atrophy of the renal parenchyma on the right with diffuse hydronephrosis and surrounding mesenteric stranding. The wall of the renal pelvis on the right is thickened. There is ureterectasis diffusely on the right. No calculus is seen in the right kidney or ureter. On the left, no  renal mass is evident. There is diffuse hydronephrosis in ureterectasis on the left with wall thickening of the left ureter diffusely. There is no renal calculus on the left. The urinary bladder shows diffuse wall thickening. Stomach/Bowel: The rectum is distended with stool. There is mild rectal wall thickening. There is mild perirectal soft tissue stranding. Elsewhere, there is no bowel wall thickening. No bowel obstruction. No free air or portal venous air. Vascular/Lymphatic: There is no abdominal aortic aneurysm. There are calcifications in proximal celiac and superior mesenteric arteries. There is also calcification in the internal and external iliac arteries and in the common femoral arteries bilaterally. There is no appreciable adenopathy in the abdomen or pelvis. Reproductive: Uterus is either extremely atrophic or absent. No pelvic mass evident. No free pelvic fluid. Other: Appendix appears normal. No ascites or abscess evident in the abdomen or pelvis. Musculoskeletal: There is a lytic lesion in the inferior left iliac bone at the level of the left sacroiliac joint measuring 2.7 x 2.2 cm. There is no intramuscular or abdominal wall lesion. IMPRESSION: CT chest: Large mass arising from the left breast which invades the left chest wall musculature. Multiple pulmonary nodular opacities consistent with metastatic foci are noted throughout the lungs in virtually all segments bilaterally. There is left axillary adenopathy. There are multiple lytic bony metastases in the thoracic spine, largest at T7 on  the left anteriorly. CT abdomen and pelvis: There is renal atrophy bilaterally, more marked on the right than on the left. There is extensive hydronephrosis and ureterectasis, more pronounced on the right than on the left. There is diffuse thickening of the wall of the ureter and collecting system on the right. Milder collecting system and ureteral wall thickening on the left noted. There is also thickening of  the urinary bladder wall. The appearance in these areas is consistent with chronic inflammation. A well-defined mass is not seen. It should be noted that diffuse transitional cell carcinoma conceivably could present in this manner. Urologic consultation advised. Porcelain gallbladder with cholelithiasis. No bowel obstruction. No abscess. No ascites. No adenopathy in the abdomen or pelvis. Lytic lesion in the inferior left iliac bone. Diffuse stool in the rectum with mild rectal wall thickening. There may be a mild degree of stercoral colitis. There is mild stranding in the fat in the perirectal region. Multiple foci of mesenteric and pelvic arterial vascular atherosclerosis. Electronically Signed   By: Lowella Grip III M.D.   On: 08/23/2016 21:07   Ct Chest Wo Contrast  Result Date: 08/23/2016 CLINICAL DATA:  Left breast mass.  Decreased appetite EXAM: CT CHEST, ABDOMEN AND PELVIS WITHOUT CONTRAST TECHNIQUE: Multidetector CT imaging of the chest, abdomen and pelvis was performed following the standard protocol without IV contrast. COMPARISON:  None. FINDINGS: CT CHEST FINDINGS Cardiovascular: There is no demonstrable thoracic aortic aneurysm. Visualized great vessels are quite tortuous with only modest atherosclerotic calcification at the origin of the left subclavian artery. Visualized great vessels otherwise appear within normal limits on this noncontrast enhanced study. There are foci of coronary artery calcification. Pericardium is not thickened. Mediastinum/Nodes: There is a 1 cm nodular lesion in the right lobe of the thyroid. There is a nearby 8 mm nodular lesion in the right lobe of the thyroid. These lesions are consistent with multinodular goiter. There is adenopathy in the left axilla measuring 2.0 x 1.8 cm. No other adenopathy is evident. Lungs/Pleura: There are multiple nodular lesions throughout the lungs ranging in size from as small as 5 mm to as large as 2 x 1.8 cm. This largest lesion is  noted in the posterior segment of the left lower lobe. The largest lesion on the right is in the posterior segment of the right upper lobe measuring 1.7 x 1.4 cm. Nodular lesions are noted in virtually all segments of the lungs bilaterally and are consistent with widespread metastatic disease. Musculoskeletal: There is a mass arising from the left breast measuring 7.1 x 5.9 cm. This mass appears to invade the lateral chest wall musculature on the left. There is metastatic disease in multiple thoracic vertebral bodies. The largest lytic lesion is noted in the T7 vertebral body anteriorly and toward the left. CT ABDOMEN PELVIS FINDINGS Hepatobiliary: No focal liver lesions are evident on this noncontrast enhanced study. There is porcelain gallbladder with calcification throughout the wall of the gallbladder. There are probable calculi within the gallbladder as well. There is no pericholecystic fluid. There is no appreciable biliary duct dilatation. Pancreas: There is fatty replacement in portions of the pancreas. There is no pancreatic mass or inflammatory focus. Spleen: No splenic lesions are evident. Adrenals/Urinary Tract: Adrenals appear normal bilaterally. There is marked atrophy of the renal parenchyma on the right with diffuse hydronephrosis and surrounding mesenteric stranding. The wall of the renal pelvis on the right is thickened. There is ureterectasis diffusely on the right. No calculus is seen in the  right kidney or ureter. On the left, no renal mass is evident. There is diffuse hydronephrosis in ureterectasis on the left with wall thickening of the left ureter diffusely. There is no renal calculus on the left. The urinary bladder shows diffuse wall thickening. Stomach/Bowel: The rectum is distended with stool. There is mild rectal wall thickening. There is mild perirectal soft tissue stranding. Elsewhere, there is no bowel wall thickening. No bowel obstruction. No free air or portal venous air.  Vascular/Lymphatic: There is no abdominal aortic aneurysm. There are calcifications in proximal celiac and superior mesenteric arteries. There is also calcification in the internal and external iliac arteries and in the common femoral arteries bilaterally. There is no appreciable adenopathy in the abdomen or pelvis. Reproductive: Uterus is either extremely atrophic or absent. No pelvic mass evident. No free pelvic fluid. Other: Appendix appears normal. No ascites or abscess evident in the abdomen or pelvis. Musculoskeletal: There is a lytic lesion in the inferior left iliac bone at the level of the left sacroiliac joint measuring 2.7 x 2.2 cm. There is no intramuscular or abdominal wall lesion. IMPRESSION: CT chest: Large mass arising from the left breast which invades the left chest wall musculature. Multiple pulmonary nodular opacities consistent with metastatic foci are noted throughout the lungs in virtually all segments bilaterally. There is left axillary adenopathy. There are multiple lytic bony metastases in the thoracic spine, largest at T7 on the left anteriorly. CT abdomen and pelvis: There is renal atrophy bilaterally, more marked on the right than on the left. There is extensive hydronephrosis and ureterectasis, more pronounced on the right than on the left. There is diffuse thickening of the wall of the ureter and collecting system on the right. Milder collecting system and ureteral wall thickening on the left noted. There is also thickening of the urinary bladder wall. The appearance in these areas is consistent with chronic inflammation. A well-defined mass is not seen. It should be noted that diffuse transitional cell carcinoma conceivably could present in this manner. Urologic consultation advised. Porcelain gallbladder with cholelithiasis. No bowel obstruction. No abscess. No ascites. No adenopathy in the abdomen or pelvis. Lytic lesion in the inferior left iliac bone. Diffuse stool in the rectum  with mild rectal wall thickening. There may be a mild degree of stercoral colitis. There is mild stranding in the fat in the perirectal region. Multiple foci of mesenteric and pelvic arterial vascular atherosclerosis. Electronically Signed   By: Lowella Grip III M.D.   On: 08/23/2016 21:07   Mr Brain Wo Contrast  Result Date: 08/25/2016 CLINICAL DATA:  New diagnosis of metastatic breast cancer. Evaluate for brain metastases. EXAM: MRI HEAD WITHOUT CONTRAST TECHNIQUE: Multiplanar, multiecho pulse sequences of the brain and surrounding structures were obtained without intravenous contrast. COMPARISON:  None available FINDINGS: Brain: No definitive brain metastasis; no vasogenic edema. There is a small nodular area of susceptibility artifact in the left frontal white matter which does not have surrounding edema, compatible with nonspecific dystrophic calcification or blood breakdown products. A well-defined T1 hypo intense sub cm structure in the posterior left temporal white matter favors a neural glial cyst or other benign process. Mild, age congruent changes in the cerebral white matter. Vascular: Normal flow voids. Skull and upper cervical spine: Normal marrow signal. 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 Mild mucosal thickening in the right more than left mastoid air cells. IMPRESSION: No definitive brain metastasis. After renal function improves, postcontrast imaging would  increase sensitivity. Electronically Signed   By: Monte Fantasia M.D.   On: 08/25/2016 08:44

## 2016-08-25 NOTE — Progress Notes (Signed)
Occupational Therapy Evaluation Patient Details Name: Becky Pena MRN: [id number] DOB: 02/01/1946 Today's Date: 08/25/2016    History of Present Illness 70 yo female adm with c/o abd pain; PMHx: DM;  CT chest and abdomen  positive for multiple pulmonary metastasis and locally invasive very large left breast cancer measuring 7 x 6 cm.; per chart pt has been limited with mobility for a "long time"   Clinical Impression   Patient presents to OT with decreased ADL independence and safety due to the deficits listed below. She will benefit from skilled OT to maximize function and to facilitate a safe discharge. OT will follow.  Recommend SLP evaluation due to pt reports of difficulty swallowing.     Follow Up Recommendations  SNF    Equipment Recommendations   (tbd at SNF)    Recommendations for Other Services       Precautions / Restrictions Precautions Precautions: Fall Restrictions Weight Bearing Restrictions: No      Mobility Bed Mobility Overal bed mobility: Needs Assistance Bed Mobility: Supine to Sit;Sit to Supine;Rolling Rolling: Min assist;Mod assist   Supine to sit: Max assist;+2 for physical assistance Sit to supine: Total assist;+2 for physical assistance   General bed mobility comments: assist with LEs and trunk in both directions, requires incr time and multi-modal cues for sequencing and task completion  Transfers Overall transfer level: Needs assistance Equipment used: Rolling walker (2 wheeled) Transfers: Sit to/from Stand Sit to Stand: Mod assist;+2 physical assistance;+2 safety/equipment         General transfer comment: multi-modal cues for wt transfer to standing, hand placement; fatigues rapidly --only able to stand for~20sec; posterior bias in standing    Balance Overall balance assessment: Needs assistance           Standing balance-Leahy Scale: Zero                              ADL Overall ADL's : Needs  assistance/impaired Eating/Feeding: Set up Eating/Feeding Details (indicate cue type and reason): observed pt hold can of Glucerna but did not wish to take a sip Grooming: Wash/dry face;Set up;Sitting               Lower Body Dressing: Maximal assistance Lower Body Dressing Details (indicate cue type and reason): in bed, pt able to bring legs up towards her to adjust sock   Toilet Transfer Details (indicate cue type and reason): unable at this time Toileting- Clothing Manipulation and Hygiene: Total assistance;Bed level Toileting - Clothing Manipulation Details (indicate cue type and reason): small amount of bowel incontinence     Functional mobility during ADLs: Moderate assistance;Maximal assistance;+2 for physical assistance;+2 for safety/equipment;Rolling walker       Vision     Perception     Praxis      Pertinent Vitals/Pain Pain Assessment: No/denies pain     Hand Dominance     Extremity/Trunk Assessment Upper Extremity Assessment Upper Extremity Assessment: Generalized weakness   Lower Extremity Assessment Lower Extremity Assessment: Defer to PT evaluation        Communication Communication Communication: HOH   Cognition Arousal/Alertness: Awake/alert Behavior During Therapy: WFL for tasks assessed/performed Overall Cognitive Status: No family/caregiver present to determine baseline cognitive functioning Area of Impairment: Following commands;Problem solving       Following Commands: Follows one step commands with increased time     Problem Solving: Decreased initiation;Requires verbal cues     General Comments  Exercises       Shoulder Instructions      Home Living Family/patient expects to be discharged to:: Unsure Living Arrangements: Parent Available Help at Discharge: Family Type of Home: Apartment Home Access: Level entry                     Home Equipment: Walker - 2 wheels   Additional Comments: pt having  difficulty answering questions and getting frustrated so not all info obtained      Prior Functioning/Environment Level of Independence: Needs assistance  Gait / Transfers Assistance Needed: chart indicated pt had not ambulated x 1 month ADL's / Homemaking Assistance Needed: needed assistance with all ADLs, not eating            OT Problem List: Decreased strength;Decreased activity tolerance;Impaired balance (sitting and/or standing);Decreased safety awareness;Cardiopulmonary status limiting activity   OT Treatment/Interventions: Self-care/ADL training;Therapeutic exercise;DME and/or AE instruction;Therapeutic activities;Patient/family education    OT Goals(Current goals can be found in the care plan section) Acute Rehab OT Goals Patient Stated Goal: none stated OT Goal Formulation: With patient Time For Goal Achievement: 09/08/16 Potential to Achieve Goals: Fair ADL Goals Pt Will Perform Eating: with set-up;sitting Pt Will Perform Upper Body Bathing: with min assist;sitting Pt Will Perform Lower Body Bathing: with min assist;sit to/from stand Pt Will Transfer to Toilet: with min assist;bedside commode Pt Will Perform Toileting - Clothing Manipulation and hygiene: with min assist;sit to/from stand  OT Frequency: Min 2X/week   Barriers to D/C: Decreased caregiver support          Co-evaluation PT/OT/SLP Co-Evaluation/Treatment: Yes Reason for Co-Treatment: For patient/therapist safety PT goals addressed during session: Mobility/safety with mobility OT goals addressed during session: ADL's and self-care      End of Session Equipment Utilized During Treatment: Rolling walker;Gait belt Nurse Communication: Mobility status  Activity Tolerance: Patient tolerated treatment well Patient left: in bed;with call bell/phone within reach;with chair alarm set   Time: [id number] OT Time Calculation (min): 29 min Charges:  OT General Charges $OT Visit: 1 Procedure OT  Evaluation $OT Eval Moderate Complexity: 1 Procedure G-Codes:    Cashtyn Pouliot A 09-23-16, 11:47 AM

## 2016-08-25 NOTE — NC FL2 (Signed)
[place] LEVEL OF CARE SCREENING TOOL     IDENTIFICATION  Patient Name: Becky Pena Birthdate: Sep 05, 1946 Sex: female Admission Date (Current Location): 08/23/2016  [facility] and [place] Number:  Herbalist and Address:  [facility],  [place] [address] [place]      Provider Number: [id number]  Attending Physician Name and Address:  Dixie Dials, MD  Relative Name and Phone Number:       Current Level of Care: Hospital Recommended Level of Care: [place] Prior Approval Number:    Date Approved/Denied:   PASRR Number: [id number]  Discharge Plan: SNF    Current Diagnoses: Patient Active Problem List   Diagnosis Date Noted  . Acute renal failure (ARF) ([place]) 08/23/2016    Orientation RESPIRATION BLADDER Height & Weight     Self, Time, Situation, Place  Normal Continent Weight: 116 lb 6.4 oz (52.8 kg) Height:  5\' 4"  (162.6 cm)  BEHAVIORAL SYMPTOMS/MOOD NEUROLOGICAL BOWEL NUTRITION STATUS      Continent    AMBULATORY STATUS COMMUNICATION OF NEEDS Skin   Extensive Assist Verbally Normal                       Personal Care Assistance Level of Assistance  Bathing, Feeding, Dressing Bathing Assistance: Limited assistance Feeding assistance: Limited assistance Dressing Assistance: Limited assistance     Functional Limitations Info             SPECIAL CARE FACTORS FREQUENCY  PT (By licensed PT), OT (By licensed OT)                    Contractures Contractures Info: Not present    Additional Factors Info  Code Status Code Status Info: FULL CODE             Current Medications (08/25/2016):  This is the current hospital active medication list Current Facility-Administered Medications  Medication Dose Route Frequency Provider Last Rate Last Dose  . 0.45 % sodium chloride infusion   Intravenous Continuous Charolette Forward, MD 75 mL/hr at 08/25/16 1004 1,000 mL at 08/25/16 1004  .  acetaminophen (TYLENOL) tablet 650 mg  650 mg Oral Q6H PRN Dixie Dials, MD       Or  . acetaminophen (TYLENOL) suppository 650 mg  650 mg Rectal Q6H PRN Dixie Dials, MD      . ciprofloxacin (CIPRO) IVPB 200 mg  200 mg Intravenous Q24H Dixie Dials, MD   200 mg at 08/24/16 2255  . docusate sodium (COLACE) capsule 100 mg  100 mg Oral BID Dixie Dials, MD   100 mg at 08/24/16 2246  . enoxaparin (LOVENOX) injection 30 mg  30 mg Subcutaneous QHS Dixie Dials, MD   30 mg at 08/24/16 2246  . feeding supplement (ENSURE ENLIVE) (ENSURE ENLIVE) liquid 237 mL  237 mL Oral BID BM Dixie Dials, MD      . feeding supplement (GLUCERNA SHAKE) (GLUCERNA SHAKE) liquid 237 mL  237 mL Oral TID BM Dixie Dials, MD   237 mL at 08/24/16 2028  . multivitamin with minerals tablet 1 tablet  1 tablet Oral Daily Dixie Dials, MD      . sodium bicarbonate tablet 650 mg  650 mg Oral TID Charolette Forward, MD   650 mg at 08/24/16 2246  . sodium polystyrene (KAYEXALATE) 15 GM/60ML suspension 30 g  30 g Oral Once Charolette Forward, MD         Discharge Medications:  Please see discharge summary for a list of discharge medications.  Relevant Imaging Results:  Relevant Lab Results:   Additional Information SS#: [id number]  Amador Cunas, [place]

## 2016-08-25 NOTE — Progress Notes (Signed)
Patient ID: Becky Pena, female   DOB: 1946-08-07, 70 y.o.   MRN: [id number] Request received for left breast mass biopsy on pt. Imaging reviewed by Dr. Vernard Gambles. Site is easily amenable for US guided core bx with local anesthetic, however pt does not wish to proceed at this time. Details of procedure d/w her. No family in room. Will continue to monitor.

## 2016-08-25 NOTE — Clinical Social Work Placement (Signed)
   CLINICAL SOCIAL WORK PLACEMENT  NOTE  Date:  08/25/2016  Patient Details  Name: Becky Pena MRN: [id number] Date of Birth: 02/09/1946  Clinical Social Work is seeking post-discharge placement for this patient at the [place] level of care (*CSW will initial, date and re-position this form in  chart as items are completed):  Yes   Patient/family provided with [place] [facility]'s list of facilities offering this level of care within the geographic area requested by the patient (or if unable, by the patient's family).  Yes   Patient/family informed of their freedom to choose among providers that offer the needed level of care, that participate in Medicare, Medicaid or managed care program needed by the patient, have an available bed and are willing to accept the patient.  Yes   Patient/family informed of [facility]'s ownership interest in [facility] and [facility], as well as of the fact that they are under no obligation to receive care at these facilities.  PASRR submitted to EDS on 08/25/16     PASRR number received on 08/25/16     Existing PASRR number confirmed on       FL2 transmitted to all facilities in geographic area requested by pt/family on       FL2 transmitted to all facilities within larger geographic area on 08/25/16     Patient informed that his/her managed care company has contracts with or will negotiate with certain facilities, including the following:            Patient/family informed of bed offers received.  Patient chooses bed at       Physician recommends and patient chooses bed at      Patient to be transferred to   on  .  Patient to be transferred to facility by       Patient family notified on   of transfer.  Name of family member notified:        PHYSICIAN       Additional Comment:    _______________________________________________ Amador Cunas, LCSW 08/25/2016, 4:05 PM

## 2016-08-25 NOTE — Evaluation (Signed)
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 Pt currently with functional limitations due to the deficits listed below (see PT Problem List). * Pt will benefit from skilled PT to increase their independence and safety with mobility to allow discharge to the venue listed below.    Pt report nausea and difficulty swallowing, may benefit from Speech/Swallowing  EVAL     Follow Up Recommendations SNF    Equipment Recommendations  None recommended by PT    Recommendations for Other Services       Precautions / Restrictions Precautions Precautions: Fall Restrictions Weight Bearing Restrictions: No      Mobility  Bed Mobility Overal bed mobility: Needs Assistance Bed Mobility: Supine to Sit;Sit to Supine;Rolling Rolling: Min assist;Mod assist   Supine to sit: Max assist;+2 for physical assistance Sit to supine: Total assist;+2 for physical assistance   General bed mobility comments: assist with LEs and trunk in both directions, requires incr time and multi-modal cues for sequencing and task completion  Transfers Overall transfer level: Needs assistance Equipment used: Rolling walker (2 wheeled) Transfers: Sit to/from Stand Sit to Stand: Mod assist         General transfer comment: multi-modal cues for wt transfer to standing, hand placement; fatigues rapidly --only able to stand for~20sec; posterior bias in standing  Ambulation/Gait             General Gait Details: unable d/t fatigue  Stairs            Wheelchair Mobility    Modified Rankin (Stroke  Patients Only)       Balance Overall balance assessment: Needs assistance           Standing balance-Leahy Scale: Zero                               Pertinent Vitals/Pain Pain Assessment: No/denies pain    Home Living Family/patient expects to be discharged to:: Private residence Living Arrangements: Parent Available Help at Discharge: Family Type of Home: Apartment Home Access: Level entry       Home Equipment: Environmental consultant - 2 wheels      Prior Function Level of Independence: Needs assistance               Hand Dominance        Extremity/Trunk Assessment   Upper Extremity Assessment: Defer to OT evaluation           Lower Extremity Assessment: RLE deficits/detail;LLE deficits/detail RLE Deficits / Details: tight heel cords bil, knee extension and hip flexion 2 to 2+/5;  hip extension 3/5; pt has to use UEs to move LEs thru full ROM LLE Deficits / Details: same as above     Communication   Communication: HOH  Cognition Arousal/Alertness: Awake/alert Behavior During Therapy: WFL for tasks assessed/performed   Area of Impairment: Following commands;Problem solving       Following Commands: Follows one step commands with increased time     Problem Solving: Decreased initiation;Requires verbal cues      General Comments  Exercises     Assessment/Plan    PT Assessment Patient needs continued PT services  PT Problem List Decreased strength;Decreased activity tolerance;Decreased balance;Decreased mobility;Decreased knowledge of use of DME          PT Treatment Interventions DME instruction;Gait training;Functional mobility training;Therapeutic exercise;Therapeutic activities;Patient/family education    PT Goals (Current goals can be found in the Care Plan section)  Acute Rehab PT Goals Patient Stated Goal: none stated PT Goal Formulation: With patient Time For Goal Achievement: 09/08/16 Potential to Achieve Goals:  Fair    Frequency Min 3X/week   Barriers to discharge        Co-evaluation PT/OT/SLP Co-Evaluation/Treatment: Yes Reason for Co-Treatment: For patient/therapist safety PT goals addressed during session: Mobility/safety with mobility         End of Session Equipment Utilized During Treatment: Gait belt Activity Tolerance: Patient limited by fatigue Patient left: in bed;with call bell/phone within reach;with bed alarm set           Time: [id number] PT Time Calculation (min) (ACUTE ONLY): 29 min   Charges:   PT Evaluation $PT Eval Moderate Complexity: 1 Procedure     PT G Codes:        Becky Pena September 17, 2016, 11:32 AM

## 2016-08-25 NOTE — Progress Notes (Signed)
Patient has refused all Po meds stating "they will make me so sick". Nurse has tried numerous times with very poor oral intake

## 2016-08-25 NOTE — Progress Notes (Signed)
Subjective:  Appreciate all consultants help. Patient denies any chest pain or shortness of breath. Denies any abdominal pain. Urine output has improved after Foley catheter insertion renal function slowly improving K  Objective:  Vital Signs in the last 24 hours: Temp:  [97.6 F (36.4 C)-97.8 F (36.6 C)] 97.8 F (36.6 C) (09/26 0536) Pulse Rate:  [73-82] 73 (09/26 0536) Resp:  [16] 16 (09/26 0536) BP: (109-110)/(66-71) 109/69 (09/26 0536) SpO2:  [99 %-100 %] 100 % (09/26 0536)  Intake/Output from previous day: 09/25 0701 - 09/26 0700 In: 1356.7 [P.O.:440; I.V.:916.7] Out: 740 [Urine:740] Intake/Output from this shift: No intake/output data recorded.  Physical Exam: Neck: no adenopathy, no carotid bruit, no JVD and supple, symmetrical, trachea midline Lungs: clear to auscultation bilaterally Heart: regular rate and rhythm, S1, S2 normal and Soft systolic murmur noted Abdomen: regular rate and rhythm, S1, S2 normal and Soft systolic murmur noted Extremities: extremities normal, atraumatic, no cyanosis or edema  Lab Results:  Recent Labs  08/24/16 0342 08/25/16 0415  WBC 6.0 4.3  HGB 10.1* 8.0*  PLT 311 262    Recent Labs  08/24/16 0342 08/25/16 0415  NA 137 141  K 5.9* 4.0  CL 115* 122*  CO2 <7* 11*  GLUCOSE 211* 74  BUN 109* 93*  CREATININE 4.12* 3.56*   No results for input(s): TROPONINI in the last 72 hours.  Invalid input(s): CK, MB Hepatic Function Panel  Recent Labs  08/23/16 2135  PROT 7.3  ALBUMIN 2.6*  AST 11*  ALT 7*  ALKPHOS 87  BILITOT 1.5*   No results for input(s): CHOL in the last 72 hours. No results for input(s): PROTIME in the last 72 hours.  Imaging: Imaging results have been reviewed and Ct Abdomen Pelvis Wo Contrast  Result Date: 08/23/2016 CLINICAL DATA:  Left breast mass.  Decreased appetite EXAM: CT CHEST, ABDOMEN AND PELVIS WITHOUT CONTRAST TECHNIQUE: Multidetector CT imaging of the chest, abdomen and pelvis was  performed following the standard protocol without IV contrast. COMPARISON:  None. FINDINGS: CT CHEST FINDINGS Cardiovascular: There is no demonstrable thoracic aortic aneurysm. Visualized great vessels are quite tortuous with only modest atherosclerotic calcification at the origin of the left subclavian artery. Visualized great vessels otherwise appear within normal limits on this noncontrast enhanced study. There are foci of coronary artery calcification. Pericardium is not thickened. Mediastinum/Nodes: There is a 1 cm nodular lesion in the right lobe of the thyroid. There is a nearby 8 mm nodular lesion in the right lobe of the thyroid. These lesions are consistent with multinodular goiter. There is adenopathy in the left axilla measuring 2.0 x 1.8 cm. No other adenopathy is evident. Lungs/Pleura: There are multiple nodular lesions throughout the lungs ranging in size from as small as 5 mm to as large as 2 x 1.8 cm. This largest lesion is noted in the posterior segment of the left lower lobe. The largest lesion on the right is in the posterior segment of the right upper lobe measuring 1.7 x 1.4 cm. Nodular lesions are noted in virtually all segments of the lungs bilaterally and are consistent with widespread metastatic disease. Musculoskeletal: There is a mass arising from the left breast measuring 7.1 x 5.9 cm. This mass appears to invade the lateral chest wall musculature on the left. There is metastatic disease in multiple thoracic vertebral bodies. The largest lytic lesion is noted in the T7 vertebral body anteriorly and toward the left. CT ABDOMEN PELVIS FINDINGS Hepatobiliary: No focal liver lesions are  evident on this noncontrast enhanced study. There is porcelain gallbladder with calcification throughout the wall of the gallbladder. There are probable calculi within the gallbladder as well. There is no pericholecystic fluid. There is no appreciable biliary duct dilatation. Pancreas: There is fatty  replacement in portions of the pancreas. There is no pancreatic mass or inflammatory focus. Spleen: No splenic lesions are evident. Adrenals/Urinary Tract: Adrenals appear normal bilaterally. There is marked atrophy of the renal parenchyma on the right with diffuse hydronephrosis and surrounding mesenteric stranding. The wall of the renal pelvis on the right is thickened. There is ureterectasis diffusely on the right. No calculus is seen in the right kidney or ureter. On the left, no renal mass is evident. There is diffuse hydronephrosis in ureterectasis on the left with wall thickening of the left ureter diffusely. There is no renal calculus on the left. The urinary bladder shows diffuse wall thickening. Stomach/Bowel: The rectum is distended with stool. There is mild rectal wall thickening. There is mild perirectal soft tissue stranding. Elsewhere, there is no bowel wall thickening. No bowel obstruction. No free air or portal venous air. Vascular/Lymphatic: There is no abdominal aortic aneurysm. There are calcifications in proximal celiac and superior mesenteric arteries. There is also calcification in the internal and external iliac arteries and in the common femoral arteries bilaterally. There is no appreciable adenopathy in the abdomen or pelvis. Reproductive: Uterus is either extremely atrophic or absent. No pelvic mass evident. No free pelvic fluid. Other: Appendix appears normal. No ascites or abscess evident in the abdomen or pelvis. Musculoskeletal: There is a lytic lesion in the inferior left iliac bone at the level of the left sacroiliac joint measuring 2.7 x 2.2 cm. There is no intramuscular or abdominal wall lesion. IMPRESSION: CT chest: Large mass arising from the left breast which invades the left chest wall musculature. Multiple pulmonary nodular opacities consistent with metastatic foci are noted throughout the lungs in virtually all segments bilaterally. There is left axillary adenopathy. There  are multiple lytic bony metastases in the thoracic spine, largest at T7 on the left anteriorly. CT abdomen and pelvis: There is renal atrophy bilaterally, more marked on the right than on the left. There is extensive hydronephrosis and ureterectasis, more pronounced on the right than on the left. There is diffuse thickening of the wall of the ureter and collecting system on the right. Milder collecting system and ureteral wall thickening on the left noted. There is also thickening of the urinary bladder wall. The appearance in these areas is consistent with chronic inflammation. A well-defined mass is not seen. It should be noted that diffuse transitional cell carcinoma conceivably could present in this manner. Urologic consultation advised. Porcelain gallbladder with cholelithiasis. No bowel obstruction. No abscess. No ascites. No adenopathy in the abdomen or pelvis. Lytic lesion in the inferior left iliac bone. Diffuse stool in the rectum with mild rectal wall thickening. There may be a mild degree of stercoral colitis. There is mild stranding in the fat in the perirectal region. Multiple foci of mesenteric and pelvic arterial vascular atherosclerosis. Electronically Signed   By: Lowella Grip III M.D.   On: 08/23/2016 21:07   Ct Chest Wo Contrast  Result Date: 08/23/2016 CLINICAL DATA:  Left breast mass.  Decreased appetite EXAM: CT CHEST, ABDOMEN AND PELVIS WITHOUT CONTRAST TECHNIQUE: Multidetector CT imaging of the chest, abdomen and pelvis was performed following the standard protocol without IV contrast. COMPARISON:  None. FINDINGS: CT CHEST FINDINGS Cardiovascular: There is no demonstrable  thoracic aortic aneurysm. Visualized great vessels are quite tortuous with only modest atherosclerotic calcification at the origin of the left subclavian artery. Visualized great vessels otherwise appear within normal limits on this noncontrast enhanced study. There are foci of coronary artery calcification.  Pericardium is not thickened. Mediastinum/Nodes: There is a 1 cm nodular lesion in the right lobe of the thyroid. There is a nearby 8 mm nodular lesion in the right lobe of the thyroid. These lesions are consistent with multinodular goiter. There is adenopathy in the left axilla measuring 2.0 x 1.8 cm. No other adenopathy is evident. Lungs/Pleura: There are multiple nodular lesions throughout the lungs ranging in size from as small as 5 mm to as large as 2 x 1.8 cm. This largest lesion is noted in the posterior segment of the left lower lobe. The largest lesion on the right is in the posterior segment of the right upper lobe measuring 1.7 x 1.4 cm. Nodular lesions are noted in virtually all segments of the lungs bilaterally and are consistent with widespread metastatic disease. Musculoskeletal: There is a mass arising from the left breast measuring 7.1 x 5.9 cm. This mass appears to invade the lateral chest wall musculature on the left. There is metastatic disease in multiple thoracic vertebral bodies. The largest lytic lesion is noted in the T7 vertebral body anteriorly and toward the left. CT ABDOMEN PELVIS FINDINGS Hepatobiliary: No focal liver lesions are evident on this noncontrast enhanced study. There is porcelain gallbladder with calcification throughout the wall of the gallbladder. There are probable calculi within the gallbladder as well. There is no pericholecystic fluid. There is no appreciable biliary duct dilatation. Pancreas: There is fatty replacement in portions of the pancreas. There is no pancreatic mass or inflammatory focus. Spleen: No splenic lesions are evident. Adrenals/Urinary Tract: Adrenals appear normal bilaterally. There is marked atrophy of the renal parenchyma on the right with diffuse hydronephrosis and surrounding mesenteric stranding. The wall of the renal pelvis on the right is thickened. There is ureterectasis diffusely on the right. No calculus is seen in the right kidney or  ureter. On the left, no renal mass is evident. There is diffuse hydronephrosis in ureterectasis on the left with wall thickening of the left ureter diffusely. There is no renal calculus on the left. The urinary bladder shows diffuse wall thickening. Stomach/Bowel: The rectum is distended with stool. There is mild rectal wall thickening. There is mild perirectal soft tissue stranding. Elsewhere, there is no bowel wall thickening. No bowel obstruction. No free air or portal venous air. Vascular/Lymphatic: There is no abdominal aortic aneurysm. There are calcifications in proximal celiac and superior mesenteric arteries. There is also calcification in the internal and external iliac arteries and in the common femoral arteries bilaterally. There is no appreciable adenopathy in the abdomen or pelvis. Reproductive: Uterus is either extremely atrophic or absent. No pelvic mass evident. No free pelvic fluid. Other: Appendix appears normal. No ascites or abscess evident in the abdomen or pelvis. Musculoskeletal: There is a lytic lesion in the inferior left iliac bone at the level of the left sacroiliac joint measuring 2.7 x 2.2 cm. There is no intramuscular or abdominal wall lesion. IMPRESSION: CT chest: Large mass arising from the left breast which invades the left chest wall musculature. Multiple pulmonary nodular opacities consistent with metastatic foci are noted throughout the lungs in virtually all segments bilaterally. There is left axillary adenopathy. There are multiple lytic bony metastases in the thoracic spine, largest at T7 on the  left anteriorly. CT abdomen and pelvis: There is renal atrophy bilaterally, more marked on the right than on the left. There is extensive hydronephrosis and ureterectasis, more pronounced on the right than on the left. There is diffuse thickening of the wall of the ureter and collecting system on the right. Milder collecting system and ureteral wall thickening on the left noted. There  is also thickening of the urinary bladder wall. The appearance in these areas is consistent with chronic inflammation. A well-defined mass is not seen. It should be noted that diffuse transitional cell carcinoma conceivably could present in this manner. Urologic consultation advised. Porcelain gallbladder with cholelithiasis. No bowel obstruction. No abscess. No ascites. No adenopathy in the abdomen or pelvis. Lytic lesion in the inferior left iliac bone. Diffuse stool in the rectum with mild rectal wall thickening. There may be a mild degree of stercoral colitis. There is mild stranding in the fat in the perirectal region. Multiple foci of mesenteric and pelvic arterial vascular atherosclerosis. Electronically Signed   By: Lowella Grip III M.D.   On: 08/23/2016 21:07   Becky Pena Wo Contrast  Result Date: 08/25/2016 CLINICAL DATA:  New diagnosis of metastatic breast cancer. Evaluate for Pena metastases. EXAM: MRI HEAD WITHOUT CONTRAST TECHNIQUE: Multiplanar, multiecho pulse sequences of the Pena and surrounding structures were obtained without intravenous contrast. COMPARISON:  None available FINDINGS: Pena: No definitive Pena metastasis; no vasogenic edema. There is a small nodular area of susceptibility artifact in the left frontal white matter which does not have surrounding edema, compatible with nonspecific dystrophic calcification or blood breakdown products. A well-defined T1 hypo intense sub cm structure in the posterior left temporal white matter favors a neural glial cyst or other benign process. Mild, age congruent changes in the cerebral white matter. Vascular: Normal flow voids. Skull and upper cervical spine: Normal marrow signal. 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 Mild mucosal thickening in the right more than left mastoid air cells. IMPRESSION: No definitive Pena metastasis. After renal function improves, postcontrast imaging  would increase sensitivity. Electronically Signed   By: Monte Fantasia M.D.   On: 08/25/2016 08:44    Cardiac Studies:  Assessment/Plan:  Metastatic CA of the left breast Acute renal failure Bilateral hydronephrosis questionable etiology UTI Dehydration Severe protein calorie malnutrition Diabetes mellitus Hypertension Status post Hyperkalemia Anemia of chronic disease Plan Change IV fluid to half-normal saline as per orders Check labs in a.m. Out of bed to chair as tolerated Agrees for blood transfusion if needed  LOS: 2 days    Charolette Forward 08/25/2016, 9:02 AM

## 2016-08-25 NOTE — Progress Notes (Signed)
[name] now has a Foley catheter in. She agreed to have this in. She has had a little bit of urine output. She is on IV fluids. Hopefully, this will pick up. She might have a diuresis as her renal function begins to improve.  Her hemoglobin is down to 8. It is possible that she may need to be transfused. We will see what her blood count is tomorrow. Given that she has metastatic cancer, a transfusion might benefit her. I will have to talk to her about this tomorrow when I see her.  We need to get a biopsy. The most obvious site is the left breast mass. Hopefully, we get this done tomorrow.  She does need to have a bone scan done. I think an MRI of the brain would help.  She was able to open her left eye a little bit. 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  We do not have back the CA 27.29 yet.  I'm not sure as to what she is really eating. She says that she does not have much of an appetite. Hopefully, as her renal function improves, her appetite will improve.  Today, her creatinine is down to 3.56. Her BUN is down to 93. Her potassium is down to 4.  I will send off a prealbumin on her. This will give me an idea as to what type of reserve she has left.  I really would hope that her tumor is ER positive. This would make treatment whole lot easier. Currently, she is in no condition to tolerate any kind of chemotherapy.  On her physical exam, her temperature is 97.8. Pulse 93. Blood pressure 109/69. Her lungs are pretty clear. Cardiac exam regular rate and rhythm are shows a 1/6 systolic murmur. Abdomen is soft. There is no palpable fluid wave. There is no palpable liver or spleen tip. Extremities shows some trace edema in her legs. Neurological exam is nonfocal.  We are still in the process of working her up to determine the extent of her disease. We ultimately will need a biopsy. Again, the easiest site will be the left breast mass. Multiple core biopsies could be  obtained.  Her nutritional support will be very helpful. Again, I think that getting her renal function better will help with this.  We will have to see what her blood count is tomorrow. If her hemoglobin is not improved, then I'll talk to her about a transfusion which I think would be helpful.  As always, I asked her if we could pray together. She was very excited for [place] to do that. We had a very good prayer session. This made her feel a whole lot better.  Becky Haw, MD  Proverbs 3:5-6

## 2016-08-25 NOTE — Clinical Social Work Note (Signed)
Clinical Social Work Assessment  Patient Details  Name: Becky Pena MRN: [id number] Date of Birth: 1946-02-11  Date of referral:  08/25/16               Reason for consult:  Facility Placement                Permission sought to share information with:  Facility Art therapist granted to share information::  Yes, Verbal Permission Granted  Name::        Agency::     Relationship::     Contact Information:     Housing/Transportation Living arrangements for the past 2 months:  Apartment Source of Information:  Patient Patient Interpreter Needed:  None Criminal Activity/Legal Involvement Pertinent to Current Situation/Hospitalization:  No - Comment as needed Significant Relationships:  Parents, Adult Children Lives with:  Parents (mom) Do you feel safe going back to the place where you live?  Yes Need for family participation in patient care:  Yes (Comment)  Care giving concerns:    Health visitor / plan:  SW met with pt re PT recommendation for SNF. Pt admitted from home where she lives with her mother and identifies her mother and adult son as her main support system. Pt reports no previous SNF stay and no HH services. SW explained placement process and answered questions. Pt hesitant to agree to SNF, but agreeable to CSW initiating SNF search. Pt requesting time to think about SNF and discuss with her family prior to making a decision. SW will initiate SNF search and f/u with offers.    Employment status:    Insurance information:  Medicare PT Recommendations:  [place] / Referral to community resources:  [place]  Patient/Family's Response to care: Pt reports agreeable to above plan.  Patient/Family's Understanding of and Emotional Response to Diagnosis, Current Treatment, and Prognosis:   Emotional Assessment Appearance:  Appears older than stated age Attitude/Demeanor/Rapport:  Apprehensive,  Lethargic Affect (typically observed):  Apprehensive, Appropriate Orientation:  Oriented to Self, Oriented to Place, Oriented to  Time, Oriented to Situation Alcohol / Substance use:  Not Applicable Psych involvement (Current and /or in the community):  No (Comment)  Discharge Needs  Concerns to be addressed:  Discharge Planning Concerns Readmission within the last 30 days:  No Current discharge risk:    Barriers to Discharge:  No Barriers Identified   Amador Cunas, [place] 08/25/2016, 4:01 PM

## 2016-08-26 LAB — CBC
HEMATOCRIT: 25 % — AB (ref 36.0–46.0)
Hemoglobin: 7.7 g/dL — ABNORMAL LOW (ref 12.0–15.0)
MCH: 26.6 pg (ref 26.0–34.0)
MCHC: 30.8 g/dL (ref 30.0–36.0)
MCV: 86.2 fL (ref 78.0–100.0)
Platelets: 240 10*3/uL (ref 150–400)
RBC: 2.9 MIL/uL — AB (ref 3.87–5.11)
RDW: 17.9 % — ABNORMAL HIGH (ref 11.5–15.5)
WBC: 4.9 10*3/uL (ref 4.0–10.5)

## 2016-08-26 LAB — ABO/RH: ABO/RH(D): B POS

## 2016-08-26 LAB — GLUCOSE, CAPILLARY
GLUCOSE-CAPILLARY: 58 mg/dL — AB (ref 65–99)
GLUCOSE-CAPILLARY: 133 mg/dL — AB (ref 65–99)
Glucose-Capillary: 108 mg/dL — ABNORMAL HIGH (ref 65–99)

## 2016-08-26 LAB — COMPREHENSIVE METABOLIC PANEL
ALK PHOS: 72 U/L (ref 38–126)
ALT: 6 U/L — AB (ref 14–54)
ANION GAP: 7 (ref 5–15)
AST: 12 U/L — ABNORMAL LOW (ref 15–41)
Albumin: 2 g/dL — ABNORMAL LOW (ref 3.5–5.0)
BUN: 84 mg/dL — ABNORMAL HIGH (ref 6–20)
CALCIUM: 7.9 mg/dL — AB (ref 8.9–10.3)
CO2: 11 mmol/L — ABNORMAL LOW (ref 22–32)
CREATININE: 3.44 mg/dL — AB (ref 0.44–1.00)
Chloride: 120 mmol/L — ABNORMAL HIGH (ref 101–111)
GFR, EST AFRICAN AMERICAN: 15 mL/min — AB (ref >60)
GFR, EST NON AFRICAN AMERICAN: 13 mL/min — AB (ref >60)
Glucose, Bld: 73 mg/dL (ref 65–99)
Potassium: 3.7 mmol/L (ref 3.5–5.1)
SODIUM: 138 mmol/L (ref 135–145)
TOTAL PROTEIN: 5.4 g/dL — AB (ref 6.5–8.1)

## 2016-08-26 LAB — IRON AND TIBC
Iron: 32 ug/dL (ref 28–170)
Saturation Ratios: 24 % (ref 10.4–31.8)
TIBC: 132 ug/dL — ABNORMAL LOW (ref 250–450)
UIBC: 100 ug/dL

## 2016-08-26 LAB — URINE CULTURE: Culture: >=100000 — AB

## 2016-08-26 LAB — PREPARE RBC (CROSSMATCH)

## 2016-08-26 LAB — FERRITIN: Ferritin: 145 ng/mL (ref 11–307)

## 2016-08-26 LAB — PREALBUMIN: Prealbumin: 5.4 mg/dL — ABNORMAL LOW (ref 18–38)

## 2016-08-26 MED ORDER — SODIUM CHLORIDE 0.9 % IV SOLN
Freq: Once | INTRAVENOUS | Status: AC
  Administered 2016-08-26: 12:00:00 via INTRAVENOUS

## 2016-08-26 MED ORDER — FUROSEMIDE 10 MG/ML IJ SOLN
20.0000 mg | Freq: Once | INTRAMUSCULAR | Status: AC
  Administered 2016-08-26: 20 mg via INTRAVENOUS
  Filled 2016-08-26: qty 2

## 2016-08-26 MED ORDER — STERILE WATER FOR INJECTION IV SOLN
INTRAVENOUS | Status: DC
  Administered 2016-08-26 – 2016-08-27 (×2): via INTRAVENOUS
  Filled 2016-08-26 (×4): qty 9.71

## 2016-08-26 NOTE — Consult Note (Signed)
Chief Complaint: Patient was seen in consultation today for ultrasound-guided biopsy of left axillary lymph node Chief Complaint  Patient presents with  . Abdominal Pain    Referring Physician(s): Ennever,P  Supervising Physician: Marybelle Killings  Patient Status: Inpatient  History of Present Illness: Becky Pena is a 70 y.o. female with history of poorly controlled diabetes, CRF, bilateral hydroureteronephrosis who was admitted to [facility] on 9/24 with complaints of abdominal pain, decreased oral intake, weakness and burning on urination. She was found to have acute on chronic renal failure with UTI, severe protein calorie malnutrition, hyperkalemia and with large left breast mass which she states has been present since March 2017. Further imaging revealed multiple pulmonary nodules, left axillary adenopathy, and multiple bony lytic lesions. The above-noted left breast mass showed invasion into the left chest wall musculature. Request now received for ultrasound-guided biopsy of the enlarged left axillary lymph node for further evaluation.  Past Medical History:  Diagnosis Date  . Diabetes mellitus without complication [facility])     Past Surgical History:  Procedure Laterality Date  . EYE SURGERY     LEFT EYE     Allergies: Review of patient's allergies indicates no known allergies.  Medications: Prior to Admission medications   Medication Sig Start Date End Date Taking? Authorizing Provider  aspirin 81 MG chewable tablet Chew by mouth daily.   Yes Historical Provider, MD  glimepiride (AMARYL) 2 MG tablet Take 2 mg by mouth daily before breakfast.   Yes Historical Provider, MD     History reviewed. No pertinent family history.  Social History   Social History  . Marital status: Married    Spouse name: N/A  . Number of children: N/A  . Years of education: N/A   Social History Main Topics  . Smoking status: Never Smoker  . Smokeless tobacco: Never Used    . Alcohol use No  . Drug use: No  . Sexual activity: No   Other Topics Concern  . None   Social History Narrative  . None      Review of Systems currently denies fever, headache, substernal chest pain, dyspnea, cough,  worsening abd/back pain, nausea, vomiting or abnormal bleeding. She does have tenderness over the left breast mass.  Vital Signs: BP (!) 85/56   Pulse 73   Temp 97.9 F (36.6 C) (Oral)   Resp 16   Ht 5\' 4"  (1.626 m)   Wt 116 lb 6.4 oz (52.8 kg)   SpO2 100%   BMI 19.98 kg/m   Physical Exam patient awake, alert, answering questions okay. Left eye is currently shut which is chronic. Chest clear to auscultation bilaterally. Heart with regular rate and rhythm, positive murmur. Firm palpable tender left breast mass; abdomen soft, positive bowel sounds, nontender. Lower extremities -no edema.  Mallampati Score:     Imaging: Ct Abdomen Pelvis Wo Contrast  Result Date: 08/23/2016 CLINICAL DATA:  Left breast mass.  Decreased appetite EXAM: CT CHEST, ABDOMEN AND PELVIS WITHOUT CONTRAST TECHNIQUE: Multidetector CT imaging of the chest, abdomen and pelvis was performed following the standard protocol without IV contrast. COMPARISON:  None. FINDINGS: CT CHEST FINDINGS Cardiovascular: There is no demonstrable thoracic aortic aneurysm. Visualized great vessels are quite tortuous with only modest atherosclerotic calcification at the origin of the left subclavian artery. Visualized great vessels otherwise appear within normal limits on this noncontrast enhanced study. There are foci of coronary artery calcification. Pericardium is not thickened. Mediastinum/Nodes: There is a 1 cm nodular  lesion in the right lobe of the thyroid. There is a nearby 8 mm nodular lesion in the right lobe of the thyroid. These lesions are consistent with multinodular goiter. There is adenopathy in the left axilla measuring 2.0 x 1.8 cm. No other adenopathy is evident. Lungs/Pleura: There are multiple  nodular lesions throughout the lungs ranging in size from as small as 5 mm to as large as 2 x 1.8 cm. This largest lesion is noted in the posterior segment of the left lower lobe. The largest lesion on the right is in the posterior segment of the right upper lobe measuring 1.7 x 1.4 cm. Nodular lesions are noted in virtually all segments of the lungs bilaterally and are consistent with widespread metastatic disease. Musculoskeletal: There is a mass arising from the left breast measuring 7.1 x 5.9 cm. This mass appears to invade the lateral chest wall musculature on the left. There is metastatic disease in multiple thoracic vertebral bodies. The largest lytic lesion is noted in the T7 vertebral body anteriorly and toward the left. CT ABDOMEN PELVIS FINDINGS Hepatobiliary: No focal liver lesions are evident on this noncontrast enhanced study. There is porcelain gallbladder with calcification throughout the wall of the gallbladder. There are probable calculi within the gallbladder as well. There is no pericholecystic fluid. There is no appreciable biliary duct dilatation. Pancreas: There is fatty replacement in portions of the pancreas. There is no pancreatic mass or inflammatory focus. Spleen: No splenic lesions are evident. Adrenals/Urinary Tract: Adrenals appear normal bilaterally. There is marked atrophy of the renal parenchyma on the right with diffuse hydronephrosis and surrounding mesenteric stranding. The wall of the renal pelvis on the right is thickened. There is ureterectasis diffusely on the right. No calculus is seen in the right kidney or ureter. On the left, no renal mass is evident. There is diffuse hydronephrosis in ureterectasis on the left with wall thickening of the left ureter diffusely. There is no renal calculus on the left. The urinary bladder shows diffuse wall thickening. Stomach/Bowel: The rectum is distended with stool. There is mild rectal wall thickening. There is mild perirectal soft  tissue stranding. Elsewhere, there is no bowel wall thickening. No bowel obstruction. No free air or portal venous air. Vascular/Lymphatic: There is no abdominal aortic aneurysm. There are calcifications in proximal celiac and superior mesenteric arteries. There is also calcification in the internal and external iliac arteries and in the common femoral arteries bilaterally. There is no appreciable adenopathy in the abdomen or pelvis. Reproductive: Uterus is either extremely atrophic or absent. No pelvic mass evident. No free pelvic fluid. Other: Appendix appears normal. No ascites or abscess evident in the abdomen or pelvis. Musculoskeletal: There is a lytic lesion in the inferior left iliac bone at the level of the left sacroiliac joint measuring 2.7 x 2.2 cm. There is no intramuscular or abdominal wall lesion. IMPRESSION: CT chest: Large mass arising from the left breast which invades the left chest wall musculature. Multiple pulmonary nodular opacities consistent with metastatic foci are noted throughout the lungs in virtually all segments bilaterally. There is left axillary adenopathy. There are multiple lytic bony metastases in the thoracic spine, largest at T7 on the left anteriorly. CT abdomen and pelvis: There is renal atrophy bilaterally, more marked on the right than on the left. There is extensive hydronephrosis and ureterectasis, more pronounced on the right than on the left. There is diffuse thickening of the wall of the ureter and collecting system on the right. Milder collecting  system and ureteral wall thickening on the left noted. There is also thickening of the urinary bladder wall. The appearance in these areas is consistent with chronic inflammation. A well-defined mass is not seen. It should be noted that diffuse transitional cell carcinoma conceivably could present in this manner. Urologic consultation advised. Porcelain gallbladder with cholelithiasis. No bowel obstruction. No abscess. No  ascites. No adenopathy in the abdomen or pelvis. Lytic lesion in the inferior left iliac bone. Diffuse stool in the rectum with mild rectal wall thickening. There may be a mild degree of stercoral colitis. There is mild stranding in the fat in the perirectal region. Multiple foci of mesenteric and pelvic arterial vascular atherosclerosis. Electronically Signed   By: Lowella Grip III M.D.   On: 08/23/2016 21:07   Ct Chest Wo Contrast  Result Date: 08/23/2016 CLINICAL DATA:  Left breast mass.  Decreased appetite EXAM: CT CHEST, ABDOMEN AND PELVIS WITHOUT CONTRAST TECHNIQUE: Multidetector CT imaging of the chest, abdomen and pelvis was performed following the standard protocol without IV contrast. COMPARISON:  None. FINDINGS: CT CHEST FINDINGS Cardiovascular: There is no demonstrable thoracic aortic aneurysm. Visualized great vessels are quite tortuous with only modest atherosclerotic calcification at the origin of the left subclavian artery. Visualized great vessels otherwise appear within normal limits on this noncontrast enhanced study. There are foci of coronary artery calcification. Pericardium is not thickened. Mediastinum/Nodes: There is a 1 cm nodular lesion in the right lobe of the thyroid. There is a nearby 8 mm nodular lesion in the right lobe of the thyroid. These lesions are consistent with multinodular goiter. There is adenopathy in the left axilla measuring 2.0 x 1.8 cm. No other adenopathy is evident. Lungs/Pleura: There are multiple nodular lesions throughout the lungs ranging in size from as small as 5 mm to as large as 2 x 1.8 cm. This largest lesion is noted in the posterior segment of the left lower lobe. The largest lesion on the right is in the posterior segment of the right upper lobe measuring 1.7 x 1.4 cm. Nodular lesions are noted in virtually all segments of the lungs bilaterally and are consistent with widespread metastatic disease. Musculoskeletal: There is a mass arising from the  left breast measuring 7.1 x 5.9 cm. This mass appears to invade the lateral chest wall musculature on the left. There is metastatic disease in multiple thoracic vertebral bodies. The largest lytic lesion is noted in the T7 vertebral body anteriorly and toward the left. CT ABDOMEN PELVIS FINDINGS Hepatobiliary: No focal liver lesions are evident on this noncontrast enhanced study. There is porcelain gallbladder with calcification throughout the wall of the gallbladder. There are probable calculi within the gallbladder as well. There is no pericholecystic fluid. There is no appreciable biliary duct dilatation. Pancreas: There is fatty replacement in portions of the pancreas. There is no pancreatic mass or inflammatory focus. Spleen: No splenic lesions are evident. Adrenals/Urinary Tract: Adrenals appear normal bilaterally. There is marked atrophy of the renal parenchyma on the right with diffuse hydronephrosis and surrounding mesenteric stranding. The wall of the renal pelvis on the right is thickened. There is ureterectasis diffusely on the right. No calculus is seen in the right kidney or ureter. On the left, no renal mass is evident. There is diffuse hydronephrosis in ureterectasis on the left with wall thickening of the left ureter diffusely. There is no renal calculus on the left. The urinary bladder shows diffuse wall thickening. Stomach/Bowel: The rectum is distended with stool. There is mild  rectal wall thickening. There is mild perirectal soft tissue stranding. Elsewhere, there is no bowel wall thickening. No bowel obstruction. No free air or portal venous air. Vascular/Lymphatic: There is no abdominal aortic aneurysm. There are calcifications in proximal celiac and superior mesenteric arteries. There is also calcification in the internal and external iliac arteries and in the common femoral arteries bilaterally. There is no appreciable adenopathy in the abdomen or pelvis. Reproductive: Uterus is either  extremely atrophic or absent. No pelvic mass evident. No free pelvic fluid. Other: Appendix appears normal. No ascites or abscess evident in the abdomen or pelvis. Musculoskeletal: There is a lytic lesion in the inferior left iliac bone at the level of the left sacroiliac joint measuring 2.7 x 2.2 cm. There is no intramuscular or abdominal wall lesion. IMPRESSION: CT chest: Large mass arising from the left breast which invades the left chest wall musculature. Multiple pulmonary nodular opacities consistent with metastatic foci are noted throughout the lungs in virtually all segments bilaterally. There is left axillary adenopathy. There are multiple lytic bony metastases in the thoracic spine, largest at T7 on the left anteriorly. CT abdomen and pelvis: There is renal atrophy bilaterally, more marked on the right than on the left. There is extensive hydronephrosis and ureterectasis, more pronounced on the right than on the left. There is diffuse thickening of the wall of the ureter and collecting system on the right. Milder collecting system and ureteral wall thickening on the left noted. There is also thickening of the urinary bladder wall. The appearance in these areas is consistent with chronic inflammation. A well-defined mass is not seen. It should be noted that diffuse transitional cell carcinoma conceivably could present in this manner. Urologic consultation advised. Porcelain gallbladder with cholelithiasis. No bowel obstruction. No abscess. No ascites. No adenopathy in the abdomen or pelvis. Lytic lesion in the inferior left iliac bone. Diffuse stool in the rectum with mild rectal wall thickening. There may be a mild degree of stercoral colitis. There is mild stranding in the fat in the perirectal region. Multiple foci of mesenteric and pelvic arterial vascular atherosclerosis. Electronically Signed   By: Lowella Grip III M.D.   On: 08/23/2016 21:07   Mr Brain Wo Contrast  Result Date:  08/25/2016 CLINICAL DATA:  New diagnosis of metastatic breast cancer. Evaluate for brain metastases. EXAM: MRI HEAD WITHOUT CONTRAST TECHNIQUE: Multiplanar, multiecho pulse sequences of the brain and surrounding structures were obtained without intravenous contrast. COMPARISON:  None available FINDINGS: Brain: No definitive brain metastasis; no vasogenic edema. There is a small nodular area of susceptibility artifact in the left frontal white matter which does not have surrounding edema, compatible with nonspecific dystrophic calcification or blood breakdown products. A well-defined T1 hypo intense sub cm structure in the posterior left temporal white matter favors a neural glial cyst or other benign process. Mild, age congruent changes in the cerebral white matter. Vascular: Normal flow voids. Skull and upper cervical spine: Normal marrow signal. 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 Mild mucosal thickening in the right more than left mastoid air cells. IMPRESSION: No definitive brain metastasis. After renal function improves, postcontrast imaging would increase sensitivity. Electronically Signed   By: Monte Fantasia M.D.   On: 08/25/2016 08:44   Nm Bone Scan Whole Body  Result Date: 08/25/2016 CLINICAL DATA:  Breast cancer EXAM: NUCLEAR MEDICINE WHOLE BODY BONE SCAN TECHNIQUE: Whole body anterior and posterior images were obtained approximately 3 hours after intravenous injection  of radiopharmaceutical. RADIOPHARMACEUTICALS:  20.1 mCi Technetium-28m MDP IV COMPARISON:  Chest, abdomen and pelvic CT from 08/23/2016 FINDINGS: No abnormal increased or decreased tracer activity identified of the axial and appendicular skeleton. There is however soft tissue uptake involving the left lateral thorax and breast corresponding with known breast mass seen on recent CT. There is mild dextro convex curvature of the mid to upper thoracic spine. Foci of soft tissue  tracer activity also noted of the right forearm consistent with the site of tracer injection. IMPRESSION: No osseous metastatic disease noted. Soft tissue uptake in the region of the left breast corresponds with patient's known breast mass. Electronically Signed   By: Ashley Royalty M.D.   On: 08/25/2016 11:41    Labs:  CBC:  Recent Labs  08/23/16 1834 08/24/16 0342 08/25/16 0415 08/26/16 0410  WBC 6.0 6.0 4.3 4.9  HGB 10.3* 10.1* 8.0* 7.7*  HCT 34.0* 34.2* 25.8* 25.0*  PLT 80* 311 262 240    COAGS: No results for input(s): INR, APTT in the last 8760 hours.  BMP:  Recent Labs  08/23/16 2135 08/24/16 0342 08/25/16 0415 08/26/16 0410  NA 137 137 141 138  K 5.8* 5.9* 4.0 3.7  CL 115* 115* 122* 120*  CO2 7* <7* 11* 11*  GLUCOSE 181* 211* 74 73  BUN 106* 109* 93* 84*  CALCIUM 8.6* 8.4* 8.1* 7.9*  CREATININE 4.14* 4.12* 3.56* 3.44*  GFRNONAA 10* 10* 12* 13*  GFRAA 12* 12* 14* 15*    LIVER FUNCTION TESTS:  Recent Labs  08/23/16 2135 08/26/16 0410  BILITOT 1.5* <0.1*  AST 11* 12*  ALT 7* 6*  ALKPHOS 87 72  PROT 7.3 5.4*  ALBUMIN 2.6* 2.0*    TUMOR MARKERS: No results for input(s): AFPTM, CEA, CA199, CHROMGRNA in the last 8760 hours.  Assessment and Plan: 70 y.o. female with history of poorly controlled diabetes, CRF, bilateral hydroureteronephrosis who was admitted to [facility] on 9/24 with complaints of abdominal pain, decreased oral intake, weakness and burning on urination. She was found to have acute on chronic renal failure with UTI, severe protein calorie malnutrition, hyperkalemia and with large left breast mass which she states has been present since March 2017. Further imaging revealed multiple pulmonary nodules, left axillary adenopathy, and multiple bony lytic lesions. The above-noted left breast mass showed invasion into the left chest wall musculature. Request now received for ultrasound-guided biopsy of the enlarged left axillary lymph node  for further evaluation. Case has been reviewed by Dr. Laurence Ferrari and discussed with Dr.Ennever. Risks and benefits discussed with the patient including, but not limited to bleeding, infection, damage to adjacent structures or low yield requiring additional tests.All of the patient's questions were answered, patient is agreeable to proceed.Consent signed and in chart. Labs today include WBC 4.9, hemoglobin 7.7-receiving transfusion, platelets 240 K, creatinine 3.44. BP is soft at 85/56. Tentative plans are to proceed with left axillary node biopsy via ultrasound on 9/28 if patient stable. Lovenox will be held until after biopsy.     Thank you for this interesting consult.  I greatly enjoyed meeting Becky Pena and look forward to participating in their care.  A copy of this report was sent to the requesting provider on this date.  Electronically Signed: D. Rowe Robert 08/26/2016, 2:23 PM   I spent a total of  30 minutes   in face to face in clinical consultation, greater than 50% of which was counseling/coordinating care for left axillary lymph node biopsy

## 2016-08-26 NOTE — Consult Note (Signed)
.Urology Consult Note   Requesting Attending Physician:  Dixie Dials, MD Service Providing Consult: Urology Consulting Attending: Risa Grill, MD  Assessment:  Patient is a 70 y.o. female with history of poorly controlled diabetes, bilateral hydroureteronephrosis to the level of the bladder known since at least 2011 on urologic consultation, CT imaging showing left breast mass and lung and lytic lesions suggestive of metastatic disease who presented 9/24 with abdominal pain & dysuria and CT showing bilateral renal atrophy, bilateral hydroureteronephrosis and thickened bladder wall. 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 There is no evidence of external ureteral compression. Cr 4.1 from 2.7 in 2011.  Interval: Low BP to 85/56, normal Hr, afebrile. Adequate UOP via Foley. Cr 3.44 today - very well may be plateau and near her actual baseline. Pansensitive E coli on urine culture. Likely to go for biopsy of left axillary lymph node tomorrow. Urine today appears brown & cloudy.  Recommendations: 1. Continue Foley until tomorrow to trend Cr again tomorrow morning 2. Continue culture specific antibiotics for pansensitive E coli 3. Will need outpatient urology follow up with likely urodynamics testing if patient amenable  Thank you for this consult. Please contact the urology consult pager with any further questions/concerns. Sharmaine Base, MD Urology Surgical Resident  I performed a history and physical examination of the patient and discussed his management with the resident.  I reviewed the resident's note and agree with the documented findings and plan of care. Agree with above Subjective Doing well. Thinks she is eating/drinking well. No pain, n/v.   Objective   Vital signs in last 24 hours: BP 97/62   Pulse 72   Temp 98.5 F (36.9 C) (Oral)   Resp 16   Ht 5\' 4"  (1.626 m)    Wt 116 lb 6.4 oz (52.8 kg)   SpO2 100%   BMI 19.98 kg/m   Intake/Output last 3 shifts: I/O last 3 completed shifts: In: 17 [P.O.:120; I.V.:595] Out: 1350 [Urine:1350]  Physical Exam General: NAD, A&O, resting, appropriate, appears older than stated age and frail HEENT: [place], right EOMI, left eye appears surgically altered, MMM Pulmonary: Normal work of breathing on RA, large left chest wall mass palpated in anterior axillary line Cardiovascular: Regular rate & rhythm, HDS, adequate peripheral perfusion Abdomen: soft, NTTP nondistended GU: Foley catheter draining cloudy brown urine, no CVA tenderness bilaterally Extremities: warm and well perfused, no edema  Most Recent Labs: Lab Results  Component Value Date   WBC 4.9 08/26/2016   HGB 7.7 (L) 08/26/2016   HCT 25.0 (L) 08/26/2016   PLT 240 08/26/2016    Lab Results  Component Value Date   NA 138 08/26/2016   K 3.7 08/26/2016   CL 120 (H) 08/26/2016   CO2 11 (L) 08/26/2016   BUN 84 (H) 08/26/2016   CREATININE 3.44 (H) 08/26/2016   CALCIUM 7.9 (L) 08/26/2016   MG 1.1 (L) 01/05/2010    Lab Results  Component Value Date   ALKPHOS 72 08/26/2016   BILITOT <0.1 (L) 08/26/2016   BILIDIR 0.1 01/05/2010   PROT 5.4 (L) 08/26/2016   ALBUMIN 2.0 (L) 08/26/2016   ALT 6 (L) 08/26/2016   AST 12 (L) 08/26/2016    Lab Results  Component Value Date   INR 1.23 12/30/2009   APTT 27 12/30/2009     Urine Culture: Pending   IMAGING: Mr Brain Wo Contrast  Result Date: 08/25/2016 CLINICAL DATA:  New diagnosis  of metastatic breast cancer. Evaluate for brain metastases. EXAM: MRI HEAD WITHOUT CONTRAST TECHNIQUE: Multiplanar, multiecho pulse sequences of the brain and surrounding structures were obtained without intravenous contrast. COMPARISON:  None available FINDINGS: Brain: No definitive brain metastasis; no vasogenic edema. There is a small nodular area of susceptibility artifact in the left frontal white matter which does not  have surrounding edema, compatible with nonspecific dystrophic calcification or blood breakdown products. A well-defined T1 hypo intense sub cm structure in the posterior left temporal white matter favors a neural glial cyst or other benign process. Mild, age congruent changes in the cerebral white matter. Vascular: Normal flow voids. Skull and upper cervical spine: Normal marrow signal. 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 Mild mucosal thickening in the right more than left mastoid air cells. IMPRESSION: No definitive brain metastasis. After renal function improves, postcontrast imaging would increase sensitivity. Electronically Signed   By: Monte Fantasia M.D.   On: 08/25/2016 08:44   Nm Bone Scan Whole Body  Result Date: 08/25/2016 CLINICAL DATA:  Breast cancer EXAM: NUCLEAR MEDICINE WHOLE BODY BONE SCAN TECHNIQUE: Whole body anterior and posterior images were obtained approximately 3 hours after intravenous injection of radiopharmaceutical. RADIOPHARMACEUTICALS:  20.1 mCi Technetium-48m MDP IV COMPARISON:  Chest, abdomen and pelvic CT from 08/23/2016 FINDINGS: No abnormal increased or decreased tracer activity identified of the axial and appendicular skeleton. There is however soft tissue uptake involving the left lateral thorax and breast corresponding with known breast mass seen on recent CT. There is mild dextro convex curvature of the mid to upper thoracic spine. Foci of soft tissue tracer activity also noted of the right forearm consistent with the site of tracer injection. IMPRESSION: No osseous metastatic disease noted. Soft tissue uptake in the region of the left breast corresponds with patient's known breast mass. Electronically Signed   By: Ashley Royalty M.D.   On: 08/25/2016 11:41

## 2016-08-26 NOTE — Progress Notes (Signed)
Subjective:  Patient denies any chest pain or shortness of breath denies abdominal pain. Tolerating packed RBCs transfusion scheduled for biopsy tomorrow  Objective:  Vital Signs in the last 24 hours: Temp:  [97.7 F (36.5 C)-98.5 F (36.9 C)] 97.8 F (36.6 C) (09/27 1548) Pulse Rate:  [69-76] 72 (09/27 1548) Resp:  [16-18] 16 (09/27 1548) BP: (85-110)/(53-62) 94/59 (09/27 1548) SpO2:  [100 %] 100 % (09/27 1548)  Intake/Output from previous day: 09/26 0701 - 09/27 0700 In: 715 [P.O.:120; I.V.:595] Out: 850 [Urine:850] Intake/Output from this shift: Total I/O In: 1099 [P.O.:480; I.V.:250; Blood:369] Out: 700 [Urine:400; Stool:300]  Physical Exam: Exam unchanged  Lab Results:  Recent Labs  08/25/16 0415 08/26/16 0410  WBC 4.3 4.9  HGB 8.0* 7.7*  PLT 262 240    Recent Labs  08/25/16 0415 08/26/16 0410  NA 141 138  K 4.0 3.7  CL 122* 120*  CO2 11* 11*  GLUCOSE 74 73  BUN 93* 84*  CREATININE 3.56* 3.44*   No results for input(s): TROPONINI in the last 72 hours.  Invalid input(s): CK, MB Hepatic Function Panel  Recent Labs  08/26/16 0410  PROT 5.4*  ALBUMIN 2.0*  AST 12*  ALT 6*  ALKPHOS 72  BILITOT <0.1*   No results for input(s): CHOL in the last 72 hours. No results for input(s): PROTIME in the last 72 hours.  Imaging: Imaging results have been reviewed and Mr Brain Wo Contrast  Result Date: 08/25/2016 CLINICAL DATA:  New diagnosis of metastatic breast cancer. Evaluate for brain metastases. EXAM: MRI HEAD WITHOUT CONTRAST TECHNIQUE: Multiplanar, multiecho pulse sequences of the brain and surrounding structures were obtained without intravenous contrast. COMPARISON:  None available FINDINGS: Brain: No definitive brain metastasis; no vasogenic edema. There is a small nodular area of susceptibility artifact in the left frontal white matter which does not have surrounding edema, compatible with nonspecific dystrophic calcification or blood breakdown  products. A well-defined T1 hypo intense sub cm structure in the posterior left temporal white matter favors a neural glial cyst or other benign process. Mild, age congruent changes in the cerebral white matter. Vascular: Normal flow voids. Skull and upper cervical spine: Normal marrow signal. 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 Mild mucosal thickening in the right more than left mastoid air cells. IMPRESSION: No definitive brain metastasis. After renal function improves, postcontrast imaging would increase sensitivity. Electronically Signed   By: Monte Fantasia M.D.   On: 08/25/2016 08:44   Nm Bone Scan Whole Body  Result Date: 08/25/2016 CLINICAL DATA:  Breast cancer EXAM: NUCLEAR MEDICINE WHOLE BODY BONE SCAN TECHNIQUE: Whole body anterior and posterior images were obtained approximately 3 hours after intravenous injection of radiopharmaceutical. RADIOPHARMACEUTICALS:  20.1 mCi Technetium-87m MDP IV COMPARISON:  Chest, abdomen and pelvic CT from 08/23/2016 FINDINGS: No abnormal increased or decreased tracer activity identified of the axial and appendicular skeleton. There is however soft tissue uptake involving the left lateral thorax and breast corresponding with known breast mass seen on recent CT. There is mild dextro convex curvature of the mid to upper thoracic spine. Foci of soft tissue tracer activity also noted of the right forearm consistent with the site of tracer injection. IMPRESSION: No osseous metastatic disease noted. Soft tissue uptake in the region of the left breast corresponds with patient's known breast mass. Electronically Signed   By: Ashley Royalty M.D.   On: 08/25/2016 11:41    Cardiac Studies:  Assessment/Plan:  Metastatic CA  of the left breast Resolving Acute renal failure Bilateral hydronephrosis questionable etiology Escherichia coli UTI Dehydration Severe protein calorie malnutrition Diabetes  mellitus Hypertension Status post Hyperkalemia Anemia of chronic disease Plan Continue slow hydration/add bicarbonate as per orders Check labs in a.m. Scheduled for biopsy tomorrow Discussed with patient regarding skilled nursing facility but refused but would like to go home.  LOS: 3 days    Charolette Forward 08/26/2016, 5:54 PM

## 2016-08-26 NOTE — Progress Notes (Signed)
Patient continues to refuse all po meds after much encouragement from nurse.

## 2016-08-26 NOTE — Progress Notes (Signed)
Nutrition Follow-up  DOCUMENTATION CODES:   Severe malnutrition in context of chronic illness  INTERVENTION:   -D/c Glucerna shakes -Continue Ensure Enlive po BID, each supplement provides 350 kcal and 20 grams of protein -Encourage PO intake -RD to continue to monitor  NUTRITION DIAGNOSIS:   Malnutrition related to chronic illness as evidenced by energy intake < or equal to 75% for > or equal to 1 month, severe depletion of body fat, severe depletion of muscle mass.  Ongoing.  GOAL:   Patient will meet greater than or equal to 90% of their needs  Progressing.  MONITOR:   PO intake, Supplement acceptance, Labs, Weight trends, Skin, I & O's  ASSESSMENT:   70 years old female with history of diabetes has lower abdominal pain and hurting on urination. She had also noticed lump in left breast as early as March, 2017. She claims she did not had money to see a doctor. 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 Left side of neck is also thick like a cord. Her renal function has also deteriorated with creatinine of 4.14. Her oral intake has been poor and she has not ambulated for long time per family.   Patient in room eating breakfast at this time. Per RN, pt was refusing meals and medications yesterday until later in the day. RN states "it is hard to get her to eat". Family encouraged pt to eat a sausage patty with some bread last night. Pt told RN she would try a vanilla Ensure supplement today. Pt prefers Ensure over the Glucerna shakes. Pt eating now and is requesting orange juice from tech. Appetite improved today. Will continue to monitor for improvements.  Medications: Colace capsule BID, Multivitamin with minerals daily, IV Lasix once Labs reviewed: CBGs: 58  Diet Order:  Diet regular Room service appropriate? Yes; Fluid consistency: Thin  Skin:  Reviewed, no issues  Last BM:   9/26  Height:   Ht Readings from Last 1 Encounters:  08/24/16 5\' 4"  (1.626 m)    Weight:   Wt Readings from Last 1 Encounters:  08/24/16 116 lb 6.4 oz (52.8 kg)    Ideal Body Weight:  54.5 kg  BMI:  Body mass index is 19.98 kg/m.  Estimated Nutritional Needs:   Kcal:  1350-1550  Protein:  60-70g  Fluid:  1.5L/day  EDUCATION NEEDS:   No education needs identified at this time  Clayton Bibles, MS, RD, LDN Pager: [phone] After Hours Pager: [phone]

## 2016-08-26 NOTE — Progress Notes (Signed)
Becky Pena is about the same. Her renal function seems to be getting a little bit better. She does have the Foley catheter in. Her creatinine is 3.44.  Her hemoglobin is now down to 7.7. I really think that a transfusion will help her. I talked to her at length this morning about a transfusion. I explained to her what a transfusion was. I told her that transfusions are safe. I told her that given her the transfusion will improve her appetite. We will get her stronger. It will allow her to be able to go home more quickly. I told her that she would not get HIV or hepatitis from the transfusion.  She has agreed to the transfusion. We will go ahead and give her 2 units today.  Her CA 27.29 is about 350. This goes along with her breast cancer.  Hopefully, she will have her biopsy today. She will agree to have this biopsy done today. I think she was little confused yesterday. I told her that this is not surgery. I told her they would use a local anesthetic so she would not feel the biopsy. I think she has a better understanding of the biopsy procedure now.  Shockley enough, her bone scan was negative. I am surprised by this as the CT scan is very suggestive of metastatic disease to her bones. Maybe her bony metastasis are  lytic and not blastic.  Her MRI the brain was negative.  He looks I she does not eat all that much. This I think will be the biggest determinate of her prognosis and how we can treat her. Hopefully, she has a biopsy done and her tumor is ER positive. She clearly is not a candidate for any systemic chemotherapy.  On her physical exam, all of her vital signs look pretty stable. She is afebrile. Her abdomen really is no change in her physical exam. She has clear lung sounds bilaterally. Cardiac exam regular rate and rhythm with a normal S1 and S2. There are no murmurs, rubs or bruits. Abdomen is soft. Extremities shows no clubbing, cyanosis or edema.  I will go ahead and transfuse her  today. Again, I think this will make her feel a lot better.  Hopefully she will have her biopsy today.  I cannot think of any other tests that we need to do with her as an inpatient. We might have to consider a PET scan as an outpatient for her.  As always, we had a very good prayer session. She really enjoys me being able to pray with her. She really looks forward to this. It just makes her feel a lot better.  Lattie Haw, MD  Psalm 54:4

## 2016-08-26 NOTE — Progress Notes (Signed)
Occupational Therapy Treatment Patient Details Name: Pearle Gabel MRN: [id number] DOB: [birth year] Today's Date: 08/26/2016    History of present illness 70 yo female adm with c/o abd pain; PMHx: DM;  CT chest and abdomen  positive for multiple pulmonary metastasis and locally invasive very large left breast cancer measuring 7 x 6 cm.; per chart pt has been limited with mobility for a "long time"   OT comments  Limited session this date due to blood transfusion about to begin, but pt assisted with repositioning in the bed to relieve L hip discomfort. Pt ate some breakfast this morning per nurse. Continue OT per plan of care.   Follow Up Recommendations  SNF    Equipment Recommendations   (tbd at SNF)    Recommendations for Other Services      Precautions / Restrictions Precautions Precautions: Fall       Mobility Bed Mobility Overal bed mobility: Needs Assistance             General bed mobility comments: total A to reposition in bed  Transfers                 General transfer comment: deferred -- nurse about to start blood transfusion    Balance                                   ADL Overall ADL's : Needs assistance/impaired Eating/Feeding: Set up                                     General ADL Comments: Patient c/o discomfort L hip from positioning in the bed. OT assisted pt total A to reposition and relieve pressure off of L hip. Patient very grateful. Nurse in and reports pt to have blood transfusion today.      Vision                     Perception     Praxis      Cognition   Behavior During Therapy: WFL for tasks assessed/performed Overall Cognitive Status: No family/caregiver present to determine baseline cognitive functioning                       Extremity/Trunk Assessment               Exercises     Shoulder Instructions       General Comments      Pertinent Vitals/ Pain        Pain Assessment: Faces Faces Pain Scale: Hurts even more Pain Location: L hip Pain Descriptors / Indicators: Sore;Discomfort;Grimacing Pain Intervention(s): Monitored during session;Repositioned  Home Living                                          Prior Functioning/Environment              Frequency  Min 2X/week        Progress Toward Goals  OT Goals(current goals can now be found in the care plan section)  Progress towards OT goals: Not progressing toward goals - comment (limited session today)  Acute Rehab OT Goals Patient Stated Goal: none stated  Plan Discharge plan remains appropriate  Co-evaluation                 End of Session     Activity Tolerance Patient tolerated treatment well   Patient Left in bed;with call bell/phone within reach;with bed alarm set;with nursing/sitter in room   Nurse Communication Other (comment)        [id number] OT Time Calculation (min): 9 min  Charges: OT General Charges $OT Visit: 1 Procedure OT Treatments $Therapeutic Activity: 8-22 mins  Genita Nilsson A 08/26/2016, 11:41 AM

## 2016-08-27 ENCOUNTER — Inpatient Hospital Stay (HOSPITAL_COMMUNITY): Payer: Medicare Other

## 2016-08-27 LAB — COMPREHENSIVE METABOLIC PANEL
ALT: 6 U/L — ABNORMAL LOW (ref 14–54)
AST: 14 U/L — ABNORMAL LOW (ref 15–41)
Albumin: 2 g/dL — ABNORMAL LOW (ref 3.5–5.0)
Alkaline Phosphatase: 81 U/L (ref 38–126)
Anion gap: 7 (ref 5–15)
BUN: 70 mg/dL — ABNORMAL HIGH (ref 6–20)
CO2: 15 mmol/L — ABNORMAL LOW (ref 22–32)
Calcium: 7.7 mg/dL — ABNORMAL LOW (ref 8.9–10.3)
Chloride: 115 mmol/L — ABNORMAL HIGH (ref 101–111)
Creatinine, Ser: 3.08 mg/dL — ABNORMAL HIGH (ref 0.44–1.00)
GFR calc Af Amer: 17 mL/min — ABNORMAL LOW (ref >60)
GFR calc non Af Amer: 15 mL/min — ABNORMAL LOW (ref >60)
Glucose, Bld: 132 mg/dL — ABNORMAL HIGH (ref 65–99)
Potassium: 3.4 mmol/L — ABNORMAL LOW (ref 3.5–5.1)
Sodium: 137 mmol/L (ref 135–145)
Total Bilirubin: 0.6 mg/dL (ref 0.3–1.2)
Total Protein: 5.3 g/dL — ABNORMAL LOW (ref 6.5–8.1)

## 2016-08-27 LAB — CBC
HCT: 35.2 % — ABNORMAL LOW (ref 36.0–46.0)
Hemoglobin: 12 g/dL (ref 12.0–15.0)
MCH: 28.4 pg (ref 26.0–34.0)
MCHC: 34.1 g/dL (ref 30.0–36.0)
MCV: 83.2 fL (ref 78.0–100.0)
Platelets: 194 10*3/uL (ref 150–400)
RBC: 4.23 MIL/uL (ref 3.87–5.11)
RDW: 16.6 % — ABNORMAL HIGH (ref 11.5–15.5)
WBC: 8 10*3/uL (ref 4.0–10.5)

## 2016-08-27 LAB — TYPE AND SCREEN
ABO/RH(D): B POS
Antibody Screen: NEGATIVE
Unit division: 0
Unit division: 0

## 2016-08-27 LAB — PROTIME-INR
INR: 1.16
Prothrombin Time: 14.8 seconds (ref 11.4–15.2)

## 2016-08-27 LAB — CANCER ANTIGEN 27.29: CA 27.29: 300.3 U/mL — AB (ref 0.0–38.6)

## 2016-08-27 MED ORDER — MIDAZOLAM HCL 2 MG/2ML IJ SOLN
INTRAMUSCULAR | Status: AC
  Filled 2016-08-27: qty 6

## 2016-08-27 MED ORDER — STERILE WATER FOR INJECTION IV SOLN
INTRAVENOUS | Status: DC
  Administered 2016-08-27 – 2016-08-28 (×2): via INTRAVENOUS
  Filled 2016-08-27 (×4): qty 9.71

## 2016-08-27 MED ORDER — MIDAZOLAM HCL 2 MG/2ML IJ SOLN
INTRAMUSCULAR | Status: AC | PRN
  Administered 2016-08-27: 1 mg via INTRAVENOUS

## 2016-08-27 MED ORDER — EXEMESTANE 25 MG PO TABS
25.0000 mg | ORAL_TABLET | Freq: Every day | ORAL | Status: DC
  Administered 2016-08-27 – 2016-09-01 (×6): 25 mg via ORAL
  Filled 2016-08-27 (×7): qty 1

## 2016-08-27 MED ORDER — FENTANYL CITRATE (PF) 100 MCG/2ML IJ SOLN
INTRAMUSCULAR | Status: AC
  Filled 2016-08-27: qty 4

## 2016-08-27 MED ORDER — FENTANYL CITRATE (PF) 100 MCG/2ML IJ SOLN
INTRAMUSCULAR | Status: AC | PRN
Start: 2016-08-27 — End: 2016-08-27
  Administered 2016-08-27: 25 ug via INTRAVENOUS

## 2016-08-27 MED ORDER — CIPROFLOXACIN HCL 250 MG PO TABS
250.0000 mg | ORAL_TABLET | Freq: Two times a day (BID) | ORAL | Status: DC
  Administered 2016-08-27 – 2016-08-31 (×9): 250 mg via ORAL
  Filled 2016-08-27 (×9): qty 1

## 2016-08-27 NOTE — Procedures (Signed)
L ax LN Bx 18 g core tiems two No comp/EBL

## 2016-08-27 NOTE — Progress Notes (Signed)
PT Cancellation Note  Patient Details Name: Mickela Holderbaum MRN: [id number] DOB: [birth year]   Cancelled Treatment:     PT attempted but pt initially refusing "because I cant walk without my shoes" and then pt for biopsy.  Will follow.   Makenzee Choudhry 08/27/2016, 12:24 PM

## 2016-08-27 NOTE — Progress Notes (Signed)
Ms. Whitmore is about the same. She did get 2 units of blood yesterday. Hemoglobin is now to 12. I know that this will help her.  Hopefully, she will get the axillary node biopsy today. Radiology cannot do the left breast mass itself because this for some reason, has to be done by the breast Center as outpatient. I think trying to do this as an outpatient for her would only delay the diagnosis. I'm unsure how she would even get to the breast center. I am not sure why she would need a mammogram given that she has metastatic disease.  She still has the Foley catheter. Her renal function is improving which is nice to see.  She's had no problems with bleeding. There is no nausea or vomiting. A measure how much she really is eating.  Her prealbumin is only 5.4. I think this is highly indicative of her overall poor performance status.  I think our best hope is that she has estrogen-positive disease. I'm going to go on a "limb" and says she will. As such, I'm going to start her on oral therapy now. I will start her on Aromasin. Hopefully she will take it.  I'm not sure we'll happened with her as an outpatient. I'm not sure where she is going. I don't think she is able to go home from my point of view because of her weakness.  Once her renal function improves little bit better, that we'll see about using bisphosphonate therapy or possibly even Xgeva to help with her bones.  Lattie Haw, MD

## 2016-08-27 NOTE — Progress Notes (Signed)
Subjective:  Patient denies any chest pain or shortness of breath. Anxious about axillary lymph node biopsy. Urine still appears to be cloudy will switch to oral Cipro  Objective:  Vital Signs in the last 24 hours: Temp:  [97.7 F (36.5 C)-99.1 F (37.3 C)] 99.1 F (37.3 C) (09/28 0556) Pulse Rate:  [70-76] 76 (09/28 0556) Resp:  [14-16] 14 (09/28 0556) BP: (85-124)/(54-72) 103/67 (09/28 0556) SpO2:  [100 %] 100 % (09/28 0556)  Intake/Output from previous day: 09/27 0701 - 09/28 0700 In: 2009 [P.O.:835; I.V.:500; Blood:674] Out: 2700 [Urine:2400; Stool:300] Intake/Output from this shift: Total I/O In: -  Out: 300 [Urine:300]  Physical Exam: Neck: no adenopathy, no carotid bruit, no JVD and supple, symmetrical, trachea midline Lungs: clear to auscultation bilaterally Heart: regular rate and rhythm, S1, S2 normal and Soft systolic murmur noted Abdomen: soft, non-tender; bowel sounds normal; no masses,  no organomegaly Extremities: extremities normal, atraumatic, no cyanosis or edema  Lab Results:  Recent Labs  08/26/16 0410 08/27/16 0417  WBC 4.9 8.0  HGB 7.7* 12.0  PLT 240 194    Recent Labs  08/26/16 0410 08/27/16 0417  NA 138 137  K 3.7 3.4*  CL 120* 115*  CO2 11* 15*  GLUCOSE 73 132*  BUN 84* 70*  CREATININE 3.44* 3.08*   No results for input(s): TROPONINI in the last 72 hours.  Invalid input(s): CK, MB Hepatic Function Panel  Recent Labs  08/27/16 0417  PROT 5.3*  ALBUMIN 2.0*  AST 14*  ALT 6*  ALKPHOS 81  BILITOT 0.6   No results for input(s): CHOL in the last 72 hours. No results for input(s): PROTIME in the last 72 hours.  Imaging: Imaging results have been reviewed and Nm Bone Scan Whole Body  Result Date: 08/25/2016 CLINICAL DATA:  Breast cancer EXAM: NUCLEAR MEDICINE WHOLE BODY BONE SCAN TECHNIQUE: Whole body anterior and posterior images were obtained approximately 3 hours after intravenous injection of radiopharmaceutical.  RADIOPHARMACEUTICALS:  20.1 mCi Technetium-11m MDP IV COMPARISON:  Chest, abdomen and pelvic CT from 08/23/2016 FINDINGS: No abnormal increased or decreased tracer activity identified of the axial and appendicular skeleton. There is however soft tissue uptake involving the left lateral thorax and breast corresponding with known breast mass seen on recent CT. There is mild dextro convex curvature of the mid to upper thoracic spine. Foci of soft tissue tracer activity also noted of the right forearm consistent with the site of tracer injection. IMPRESSION: No osseous metastatic disease noted. Soft tissue uptake in the region of the left breast corresponds with patient's known breast mass. Electronically Signed   By: Ashley Royalty M.D.   On: 08/25/2016 11:41    Cardiac Studies:  Assessment/Plan:  Metastatic CA of the left breast Resolving Acute renal failure Bilateral hydronephrosis questionable etiology Escherichia coli UTI Dehydration Severe protein calorie malnutrition Diabetes mellitus Hypertension Status post Hyperkalemia Anemia of chronic disease Plan Schedule for left heart biopsy today Patient encouraged by mouth intake Social service for discharge planning Check labs in a.m. Change IV Cipro to by mouth  LOS: 4 days    Charolette Forward 08/27/2016, 10:16 AM

## 2016-08-27 NOTE — Progress Notes (Signed)
This RN assumed care of pt as of 2300. I agree with previous RN's charted assessment. Continue to monitor. Hortencia Conradi RN

## 2016-08-27 NOTE — Consult Note (Signed)
Urology Consult Note   Requesting Attending Physician:  Dixie Dials, MD Service Providing Consult: Urology Consulting Attending: Risa Grill, MD  Assessment:  Patient is a 70 y.o. female with history of poorly controlled diabetes, bilateral hydroureteronephrosis to the level of the bladder known since at least 2011 on urologic consultation, CT imaging showing left breast mass and lung and lytic lesions suggestive of metastatic disease who presented 9/24 with abdominal pain & dysuria and CT showing bilateral renal atrophy, bilateral hydroureteronephrosis and thickened bladder wall. 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 There is no evidence of external ureteral compression. Cr 4.1 from 2.7 in 2011.  Interval: AFVSS. Adequate UOP via Foley. Cr down to 3.0 today. Hb up to 12 after pRBC transfusion. Pansensitive E coli in urine. Biopsy of left axillary node today. Course of cipro finished.   Recommendations: 1. Continue Foley until tomorrow to trend Cr again tomorrow morning. Hope to remove Foley tomorrow. Baseline Cr for patient appears to be anywhere from 1.7-2.7 based on 2011-2013 data. 2. Will need outpatient urology follow up with likely urodynamics testing if patient amenable. Likely 2-3 weeks. We will set up. 3. No stent or PCN placement required for patient with chronic hydronephrosis. Her history of loss to follow up would make her a less than ideal candidate for either stent or PCN. Also likely has high pressure bladder which would make stents even less desirable. Could consider PCNs if chemotherapy is required in future in attempt to maximally improve renal function, but again not an ideal candidate for this and baseline function in setting of bilateral renal atrophy is likely to not get better than 1.7-2.7 at best - multidisciplinary discussion can be performed if this becomes  reality to weigh benefits & risks of PCNs for this particular patient.  Thank you for this consult. Please contact the urology consult pager with any further questions/concerns. Sharmaine Base, MD Urology Surgical Resident  Subjective Upset this morning. Perseverating and seems to be second guessing why biopsy is necessary. Had back pain last evening essentially in the tailbone. No abdominal pain. No n/v. Refusing food & PO meds.   Objective   Vital signs in last 24 hours: BP 103/67 (BP Location: Left Arm)   Pulse 76   Temp 99.1 F (37.3 C) (Oral)   Resp 14   Ht 5\' 4"  (1.626 m)   Wt 116 lb 6.4 oz (52.8 kg)   SpO2 100%   BMI 19.98 kg/m   Intake/Output last 3 shifts: I/O last 3 completed shifts: In: 2009 [P.O.:835; I.V.:500; Blood:674] Out: 3550 [Urine:3250; Stool:300]  Physical Exam General: NAD, A&O, resting, appropriate, appears older than stated age and frail HEENT: [place], right EOMI, left eye appears surgically altered, MMM Pulmonary: Normal work of breathing on RA, large left chest wall mass palpated in anterior axillary line Cardiovascular: Regular rate & rhythm, HDS, adequate peripheral perfusion Abdomen: soft, NTTP nondistended GU: Foley catheter draining cloudy tan urine, no CVA tenderness bilaterally Extremities: warm and well perfused, no edema  Most Recent Labs: Lab Results  Component Value Date   WBC 8.0 08/27/2016   HGB 12.0 08/27/2016   HCT 35.2 (L) 08/27/2016   PLT 194 08/27/2016    Lab Results  Component Value Date   NA 137 08/27/2016   K 3.4 (L) 08/27/2016   CL 115 (H) 08/27/2016   CO2 15 (L) 08/27/2016   BUN 70 (H) 08/27/2016   CREATININE 3.08 (H)  08/27/2016   CALCIUM 7.7 (L) 08/27/2016   MG 1.1 (L) 01/05/2010    Lab Results  Component Value Date   ALKPHOS 81 08/27/2016   BILITOT 0.6 08/27/2016   BILIDIR 0.1 01/05/2010   PROT 5.3 (L) 08/27/2016   ALBUMIN 2.0 (L) 08/27/2016   ALT 6 (L) 08/27/2016   AST 14 (L) 08/27/2016    Lab Results   Component Value Date   INR 1.16 08/27/2016   APTT 27 12/30/2009     Urine Culture: Pending   IMAGING: Nm Bone Scan Whole Body  Result Date: 08/25/2016 CLINICAL DATA:  Breast cancer EXAM: NUCLEAR MEDICINE WHOLE BODY BONE SCAN TECHNIQUE: Whole body anterior and posterior images were obtained approximately 3 hours after intravenous injection of radiopharmaceutical. RADIOPHARMACEUTICALS:  20.1 mCi Technetium-72m MDP IV COMPARISON:  Chest, abdomen and pelvic CT from 08/23/2016 FINDINGS: No abnormal increased or decreased tracer activity identified of the axial and appendicular skeleton. There is however soft tissue uptake involving the left lateral thorax and breast corresponding with known breast mass seen on recent CT. There is mild dextro convex curvature of the mid to upper thoracic spine. Foci of soft tissue tracer activity also noted of the right forearm consistent with the site of tracer injection. IMPRESSION: No osseous metastatic disease noted. Soft tissue uptake in the region of the left breast corresponds with patient's known breast mass. Electronically Signed   By: Ashley Royalty M.D.   On: 08/25/2016 11:41

## 2016-08-28 LAB — CBC
HEMATOCRIT: 32.6 % — AB (ref 36.0–46.0)
HEMOGLOBIN: 10.7 g/dL — AB (ref 12.0–15.0)
MCH: 28.1 pg (ref 26.0–34.0)
MCHC: 32.8 g/dL (ref 30.0–36.0)
MCV: 85.6 fL (ref 78.0–100.0)
PLATELETS: 194 10*3/uL (ref 150–400)
RBC: 3.81 MIL/uL — AB (ref 3.87–5.11)
RDW: 16.9 % — AB (ref 11.5–15.5)
WBC: 11.2 10*3/uL — AB (ref 4.0–10.5)

## 2016-08-28 LAB — BASIC METABOLIC PANEL
ANION GAP: 7 (ref 5–15)
BUN: 61 mg/dL — AB (ref 6–20)
CALCIUM: 7 mg/dL — AB (ref 8.9–10.3)
CO2: 25 mmol/L (ref 22–32)
Chloride: 107 mmol/L (ref 101–111)
Creatinine, Ser: 2.99 mg/dL — ABNORMAL HIGH (ref 0.44–1.00)
GFR calc Af Amer: 17 mL/min — ABNORMAL LOW (ref >60)
GFR, EST NON AFRICAN AMERICAN: 15 mL/min — AB (ref >60)
Glucose, Bld: 230 mg/dL — ABNORMAL HIGH (ref 65–99)
POTASSIUM: 2.9 mmol/L — AB (ref 3.5–5.1)
SODIUM: 139 mmol/L (ref 135–145)

## 2016-08-28 MED ORDER — POTASSIUM CHLORIDE 10 MEQ/100ML IV SOLN
10.0000 meq | Freq: Once | INTRAVENOUS | Status: AC
  Administered 2016-08-28: 10 meq via INTRAVENOUS

## 2016-08-28 MED ORDER — SODIUM CHLORIDE 0.45 % IV SOLN
INTRAVENOUS | Status: DC
  Administered 2016-08-29: 1000 mL via INTRAVENOUS

## 2016-08-28 MED ORDER — FOLIC ACID 1 MG PO TABS
1.0000 mg | ORAL_TABLET | Freq: Every day | ORAL | Status: DC
  Administered 2016-08-29 – 2016-08-31 (×3): 1 mg via ORAL
  Filled 2016-08-28 (×4): qty 1

## 2016-08-28 MED ORDER — POTASSIUM CHLORIDE 10 MEQ/100ML IV SOLN
10.0000 meq | INTRAVENOUS | Status: AC
  Administered 2016-08-28 (×2): 10 meq via INTRAVENOUS
  Filled 2016-08-28 (×3): qty 100

## 2016-08-28 NOTE — Progress Notes (Signed)
LCSWA met with patient at bedside to provide SNF list, the patient declines SNF at this time.  The patient reports, " I do not want to go to a nursing home. My mother and I are doing well in our apartment. I have children and grandchildren that will help take care of me. Please stop asking me about going to a nursing home." LCSWA informed patient about Home Health as option.  No other concerns or needs identified by patient at this time.

## 2016-08-28 NOTE — Progress Notes (Signed)
Ms. Glidden had her lymph node biopsy yesterday area and I appreciate the help from radiology. The results probably will not be out until Monday.  She is still incredibly weak. She tried physical therapy yesterday but refused.  Her hemoglobin dropped from 12 down to 10.7. I noticed back 5 years ago, that she had heme positive stools. I would probably check her stools again. I'm sure she has never had a colonoscopy.  Her potassium is quite low. This diffuse be replaced.  Her urine is growing Escherichia coli. She is on Cipro.  We started her on Aromasin for the breast cancer. Again, I am assuming that given her age, that she has estrogen positive breast cancer. We will not know this probably for a week.  Her appetite is marginal at best. Her last prealbumin was only 5.4. I think this is incredibly predictive of her future. She really has to get that prealbumin better.  Her renal function continues to improve. I think she still has the Foley catheter in place.  As always, we had a very good prayer session. She always looks forward to our prayer session. It just makes her feel better.  On her physical exam, the really is no change. Her blood pressure is 103/74. Her pulse is 74. Her lungs sound relatively clear. Cardiac exam regular rate and rhythm with no murmurs. Abdomen is soft. She has no fluid wave. Extremities shows symmetrical weakness in upper or lower extremities. Neurological exam shows no focal deficits.  From my point of view, I cannot think of any other test that we need to do as an inpatient. Again her potassium needs to be replaced. I'm just worried about the drop in her hemoglobin. I don't know she's having any type of GI blood loss. We'll check her stool for blood.  Lattie Haw, MD

## 2016-08-28 NOTE — Progress Notes (Signed)
Subjective:  Denies any chest pain or shortness of breath.  Appetite remains poor.  Discussed regarding skilled nursing facility, but refusing  Objective:  Vital Signs in the last 24 hours: Temp:  [97.8 F (36.6 C)-98.6 F (37 C)] 98.6 F (37 C) (09/29 0439) Pulse Rate:  [67-80] 74 (09/29 0439) Resp:  [11-17] 14 (09/29 0439) BP: (92-124)/(58-74) 103/74 (09/29 0439) SpO2:  [98 %-100 %] 100 % (09/29 0439) Weight:  [113 lb 5.1 oz (51.4 kg)] 113 lb 5.1 oz (51.4 kg) (09/29 0439)  Intake/Output from previous day: 09/28 0701 - 09/29 0700 In: 1196.7 [P.O.:755; I.V.:441.7] Out: 1600 [Urine:1600] Intake/Output from this shift: Total I/O In: 1200 [I.V.:1200] Out: 300 [Urine:300]  Physical Exam: Neck: no adenopathy, no carotid bruit, no JVD and supple, symmetrical, trachea midline Lungs: clear to auscultation bilaterally Heart: regular rate and rhythm, S1, S2 normal and soft systolic murmur noted Abdomen: soft, non-tender; bowel sounds normal; no masses,  no organomegaly Extremities: extremities normal, atraumatic, no cyanosis or edema  Lab Results:  Recent Labs  08/27/16 0417 08/28/16 0428  WBC 8.0 11.2*  HGB 12.0 10.7*  PLT 194 194    Recent Labs  08/27/16 0417 08/28/16 0428  NA 137 139  K 3.4* 2.9*  CL 115* 107  CO2 15* 25  GLUCOSE 132* 230*  BUN 70* 61*  CREATININE 3.08* 2.99*   No results for input(s): TROPONINI in the last 72 hours.  Invalid input(s): CK, MB Hepatic Function Panel  Recent Labs  08/27/16 0417  PROT 5.3*  ALBUMIN 2.0*  AST 14*  ALT 6*  ALKPHOS 81  BILITOT 0.6   No results for input(s): CHOL in the last 72 hours. No results for input(s): PROTIME in the last 72 hours.  Imaging: Imaging results have been reviewed and [place] Core Biopsy  Result Date: 08/27/2016 INDICATION: Left axillary adenopathy.  Left breast mass. EXAM: ULTRASOUND-GUIDED BIOPSY LEFT AXILLARY LYMPH NODE.  CORE. MEDICATIONS: None. ANESTHESIA/SEDATION: Fentanyl 25 mcg IV;  Versed 1 mg IV Moderate Sedation Time:  13 The patient was continuously monitored during the procedure by the interventional radiology nurse under my direct supervision. FLUOROSCOPY TIME:  Fluoroscopy Time:  minutes  seconds ( mGy). COMPLICATIONS: None immediate. PROCEDURE: Informed written consent was obtained from the patient after a thorough discussion of the procedural risks, benefits and alternatives. All questions were addressed. Maximal Sterile Barrier Technique was utilized including caps, mask, sterile gowns, sterile gloves, sterile drape, hand hygiene and skin antiseptic. A timeout was performed prior to the initiation of the procedure. The left axilla was prepped with Betadine in a sterile fashion, and a sterile drape was applied covering the operative field. A sterile gown and sterile gloves were used for the procedure. Under sonographic guidance, two 18 gauge core biopsies of the enlarged left axillary lymph node were obtained. Final imaging was performed. Patient tolerated the procedure well without complication. Vital sign monitoring by nursing staff during the procedure will continue as patient is in the special procedures unit for post procedure observation. FINDINGS: The images document guide needle placement within the left axillary lymph node. Post biopsy images demonstrate no hemorrhage. IMPRESSION: Successful ultrasound-guided core biopsy of a left axillary lymph node. Electronically Signed   By: Marybelle Killings M.D.   On: 08/27/2016 13:06    Cardiac Studies:  Assessment/Plan:  Metastatic CA of the left breast Resolving Acute renal failure Bilateral hydronephrosis questionable etiology Escherichia coli UTI Dehydration Severe protein calorie malnutrition Diabetes mellitus Hypertension. Hypokalemia secondary to poor intake Acute  on chronic anemia, probably secondary to hydration.  Rule out GI loss Plan Replace potassium as per orders Change IV fluids as per orders. Dr. Doylene Canard on  call for weekend Patient encouraged regarding diet Check stool for occult blood Check labs in a.m.  LOS: 5 days    Charolette Forward 08/28/2016, 10:35 AM

## 2016-08-28 NOTE — Progress Notes (Signed)
Occupational Therapy Treatment Patient Details Name: Becky Pena MRN: [id number] DOB: 1946/09/25 Today's Date: 08/28/2016    History of present illness 70 yo female adm with c/o abd pain; PMHx: DM;  CT chest and abdomen  positive for multiple pulmonary metastasis and locally invasive very large left breast cancer measuring 7 x 6 cm.; per chart pt has been limited with mobility for a "long time"   OT comments  Pt performed seated UE/LE exercises at EOB.  She refused to stand or walk without her shoes, her cousin is bringing them tonight per patient.    Follow Up Recommendations  SNF    Equipment Recommendations  Other (comment) (tbd at SNF)    Recommendations for Other Services      Precautions / Restrictions Precautions Precautions: Fall Restrictions Weight Bearing Restrictions: No       Mobility Bed Mobility Overal bed mobility: Needs Assistance Bed Mobility: Supine to Sit;Sit to Supine     Supine to sit: Mod assist Sit to supine: +2 for physical assistance;Mod assist      Transfers                 General transfer comment: pt refused due to not having shoes    Balance   Sitting-balance support: Feet supported;Single extremity supported Sitting balance-Leahy Scale: Fair                             ADL Overall ADL's : Needs assistance/impaired Eating/Feeding: Set up;Bed level                                   Functional mobility during ADLs: Moderate assistance;+2 for physical assistance;+2 for safety/equipment General ADL Comments: Reluctant to participate in functional mobility without her shoes, which pt does not have here. Pt reports her cousin will bring the shoes today after work. She was eventually agreeable to sit EOB and perform some AROM exercises.      Vision                 Additional Comments: impaired L eye   Perception     Praxis      Cognition   Behavior During Therapy: WFL for tasks  assessed/performed Overall Cognitive Status: Within Functional Limits for tasks assessed                       Extremity/Trunk Assessment               Exercises General Exercises - Upper Extremity Shoulder Flexion: AROM;Both;5 reps;Seated Shoulder Extension: AROM;Both;5 reps;Seated Elbow Flexion: AROM;Both;10 reps;Seated Elbow Extension: AROM;Both;10 reps;Seated General Exercises - Lower Extremity Ankle Circles/Pumps: AROM;Both;10 reps;Seated Long Arc Quad: AROM;Both;5 reps;Seated Hip Flexion/Marching: AROM;Both;5 reps;Seated   Shoulder Instructions       General Comments      Pertinent Vitals/ Pain       Pain Assessment: No/denies pain  Home Living                                          Prior Functioning/Environment              Frequency  Min 2X/week        Progress Toward Goals  OT Goals(current goals can now be found in  the care plan section)  Progress towards OT goals: OT to reassess next treatment  Acute Rehab OT Goals Patient Stated Goal: to play keyboard  Plan Discharge plan remains appropriate    Co-evaluation    PT/OT/SLP Co-Evaluation/Treatment: Yes Reason for Co-Treatment: For patient/therapist safety PT goals addressed during session: Mobility/safety with mobility OT goals addressed during session: Strengthening/ROM      End of Session     Activity Tolerance Patient tolerated treatment well   Patient Left in bed;with call bell/phone within reach;with bed alarm set;with nursing/sitter in room   Nurse Communication          Time: [id number] OT Time Calculation (min): 23 min  Charges: OT General Charges $OT Visit: 1 Procedure OT Treatments $Therapeutic Activity: 8-22 mins  Toye Rouillard A 08/28/2016, 12:47 PM

## 2016-08-28 NOTE — Consult Note (Signed)
Final Urology Consult Note   Requesting Attending Physician:  Dixie Dials, MD Service Providing Consult: Urology Consulting Attending: Risa Grill, MD  Assessment:  Patient is a 70 y.o. female with history of poorly controlled diabetes, bilateral hydroureteronephrosis to the level of the bladder known since at least 2011 on urologic consultation, CT imaging showing left breast mass and lung and lytic lesions suggestive of metastatic disease who presented 9/24 with abdominal pain & dysuria and CT showing bilateral renal atrophy, bilateral hydroureteronephrosis and thickened bladder wall. 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 There is no evidence of external ureteral compression. Cr 4.1 from 2.7 in 2011.  Interval: AFVSS. Adequate UOP via Foley (1.6L). Cr 2.9 from 3.0 (2.7 in 2011). On cipro for pansensitive E coli. S/p left axillary node biopsy yesterday. Patient does not want PCNs even as possibility.  Recommendations: 1. Discontinue Foley. Order placed by urology. OK to resume spontaneous voiding and complete PO course for UTI. 2. Will need outpatient urology follow up with likely urodynamics testing if patient amenable. Likely 2-3 weeks. We will set up. Can again broach subject of I&O catheterization at that visit although patient expressed hesitation to do this  3. No further workup of hydronephrosis as inpatient.   Thank you for this consult. Please contact the urology consult pager with any further questions/concerns. Urology will sign off.  Sharmaine Base, MD Urology Surgical Resident  Subjective Much better spirits today. Eating & drinking better. Had much better appetite yesterday. No n/v, current pain. Happy that catheter may be removed soon. Does not want to pursue PCNs even if necessary for renal function purposes pending biopsy result.  Objective   Vital signs in last  24 hours: BP 103/74 (BP Location: Right Arm)   Pulse 74   Temp 98.6 F (37 C) (Oral)   Resp 14   Ht 5\' 4"  (1.626 m)   Wt 113 lb 5.1 oz (51.4 kg)   SpO2 100%   BMI 19.45 kg/m   Intake/Output last 3 shifts: I/O last 3 completed shifts: In: 1551.7 [P.O.:1110; I.V.:441.7] Out: 3600 [Urine:3600]  Physical Exam General: NAD, A&O, resting, appropriate, appears older than stated age and frail HEENT: [place], right EOMI, left eye appears surgically altered, MMM Pulmonary: Normal work of breathing on RA, large left chest wall mass palpated in anterior axillary line Cardiovascular: Regular rate & rhythm, HDS, adequate peripheral perfusion Abdomen: soft, NTTP nondistended GU: Foley catheter draining thin tan urine with minor but improving cloudiness, no CVA tenderness bilaterally Extremities: warm and well perfused, no edema  Most Recent Labs: Lab Results  Component Value Date   WBC 11.2 (H) 08/28/2016   HGB 10.7 (L) 08/28/2016   HCT 32.6 (L) 08/28/2016   PLT 194 08/28/2016    Lab Results  Component Value Date   NA 139 08/28/2016   K 2.9 (L) 08/28/2016   CL 107 08/28/2016   CO2 25 08/28/2016   BUN 61 (H) 08/28/2016   CREATININE 2.99 (H) 08/28/2016   CALCIUM 7.0 (L) 08/28/2016   MG 1.1 (L) 01/05/2010    Lab Results  Component Value Date   ALKPHOS 81 08/27/2016   BILITOT 0.6 08/27/2016   BILIDIR 0.1 01/05/2010   PROT 5.3 (L) 08/27/2016   ALBUMIN 2.0 (L) 08/27/2016   ALT 6 (L) 08/27/2016   AST 14 (L) 08/27/2016    Lab Results  Component Value Date   INR 1.16 08/27/2016   APTT 27  12/30/2009     Urine Culture: Pending   IMAGING: [place] Core Biopsy  Result Date: 08/27/2016 INDICATION: Left axillary adenopathy.  Left breast mass. EXAM: ULTRASOUND-GUIDED BIOPSY LEFT AXILLARY LYMPH NODE.  CORE. MEDICATIONS: None. ANESTHESIA/SEDATION: Fentanyl 25 mcg IV; Versed 1 mg IV Moderate Sedation Time:  13 The patient was continuously monitored during the procedure by the interventional  radiology nurse under my direct supervision. FLUOROSCOPY TIME:  Fluoroscopy Time:  minutes  seconds ( mGy). COMPLICATIONS: None immediate. PROCEDURE: Informed written consent was obtained from the patient after a thorough discussion of the procedural risks, benefits and alternatives. All questions were addressed. Maximal Sterile Barrier Technique was utilized including caps, mask, sterile gowns, sterile gloves, sterile drape, hand hygiene and skin antiseptic. A timeout was performed prior to the initiation of the procedure. The left axilla was prepped with Betadine in a sterile fashion, and a sterile drape was applied covering the operative field. A sterile gown and sterile gloves were used for the procedure. Under sonographic guidance, two 18 gauge core biopsies of the enlarged left axillary lymph node were obtained. Final imaging was performed. Patient tolerated the procedure well without complication. Vital sign monitoring by nursing staff during the procedure will continue as patient is in the special procedures unit for post procedure observation. FINDINGS: The images document guide needle placement within the left axillary lymph node. Post biopsy images demonstrate no hemorrhage. IMPRESSION: Successful ultrasound-guided core biopsy of a left axillary lymph node. Electronically Signed   By: Marybelle Killings M.D.   On: 08/27/2016 13:06

## 2016-08-28 NOTE — Progress Notes (Signed)
Physical Therapy Treatment Patient Details Name: Becky Pena MRN: [id number] DOB: November 24, 1946 Today's Date: 08/28/2016    History of Present Illness 70 yo female adm with c/o abd pain; PMHx: DM;  CT chest and abdomen  positive for multiple pulmonary metastasis and locally invasive very large left breast cancer measuring 7 x 6 cm.; per chart pt has been limited with mobility for a "long time"    PT Comments    Pt performed seated UE/LE exercises at EOB.  She refused to stand or walk without her shoes, her cousin is bringing them tonight.  Follow Up Recommendations  SNF     Equipment Recommendations  None recommended by PT    Recommendations for Other Services       Precautions / Restrictions Precautions Precautions: Fall Restrictions Weight Bearing Restrictions: No    Mobility  Bed Mobility Overal bed mobility: Needs Assistance Bed Mobility: Supine to Sit;Sit to Supine     Supine to sit: Mod assist Sit to supine: +2 for physical assistance;Mod assist      Transfers                 General transfer comment: pt refused due to not having shoes  Ambulation/Gait                 Stairs            Wheelchair Mobility    Modified Rankin (Stroke Patients Only)       Balance   Sitting-balance support: Feet supported;Single extremity supported Sitting balance-Leahy Scale: Fair                              Cognition Arousal/Alertness: Awake/alert Behavior During Therapy: WFL for tasks assessed/performed Overall Cognitive Status: Within Functional Limits for tasks assessed                      Exercises General Exercises - Lower Extremity Ankle Circles/Pumps: AROM;Both;10 reps;Seated Long Arc Quad: AROM;Both;5 reps;Seated Hip Flexion/Marching: AROM;Both;5 reps;Seated    General Comments        Pertinent Vitals/Pain Pain Assessment: No/denies pain    Home Living                      Prior Function             PT Goals (current goals can now be found in the care plan section) Acute Rehab PT Goals Patient Stated Goal: to play keyboard PT Goal Formulation: With patient Time For Goal Achievement: 09/08/16 Potential to Achieve Goals: Fair Progress towards PT goals: Progressing toward goals    Frequency    Min 3X/week      PT Plan Current plan remains appropriate    Co-evaluation PT/OT/SLP Co-Evaluation/Treatment: Yes Reason for Co-Treatment: For patient/therapist safety PT goals addressed during session: Mobility/safety with mobility       End of Session Equipment Utilized During Treatment: Gait belt Activity Tolerance: Patient tolerated treatment well Patient left: in bed;with call bell/phone within reach     Time: [id number] PT Time Calculation (min) (ACUTE ONLY): 23 min  Charges:  $Therapeutic Activity: 8-22 mins                    G Codes:      Becky Pena 08/28/2016, 12:13 PM [phone]

## 2016-08-29 LAB — BASIC METABOLIC PANEL
ANION GAP: 8 (ref 5–15)
BUN: 48 mg/dL — AB (ref 6–20)
CALCIUM: 6.7 mg/dL — AB (ref 8.9–10.3)
CO2: 24 mmol/L (ref 22–32)
CREATININE: 2.7 mg/dL — AB (ref 0.44–1.00)
Chloride: 105 mmol/L (ref 101–111)
GFR calc Af Amer: 20 mL/min — ABNORMAL LOW (ref >60)
GFR, EST NON AFRICAN AMERICAN: 17 mL/min — AB (ref >60)
GLUCOSE: 222 mg/dL — AB (ref 65–99)
Potassium: 3.4 mmol/L — ABNORMAL LOW (ref 3.5–5.1)
Sodium: 137 mmol/L (ref 135–145)

## 2016-08-29 LAB — CBC
HCT: 34.1 % — ABNORMAL LOW (ref 36.0–46.0)
Hemoglobin: 11 g/dL — ABNORMAL LOW (ref 12.0–15.0)
MCH: 28.1 pg (ref 26.0–34.0)
MCHC: 32.3 g/dL (ref 30.0–36.0)
MCV: 87 fL (ref 78.0–100.0)
PLATELETS: 140 10*3/uL — AB (ref 150–400)
RBC: 3.92 MIL/uL (ref 3.87–5.11)
RDW: 17.1 % — AB (ref 11.5–15.5)
WBC: 12.6 10*3/uL — ABNORMAL HIGH (ref 4.0–10.5)

## 2016-08-29 LAB — MAGNESIUM: Magnesium: 1.2 mg/dL — ABNORMAL LOW (ref 1.7–2.4)

## 2016-08-29 MED ORDER — KCL IN DEXTROSE-NACL 20-5-0.9 MEQ/L-%-% IV SOLN
INTRAVENOUS | Status: DC
  Filled 2016-08-29: qty 1000

## 2016-08-29 MED ORDER — POTASSIUM CHLORIDE IN NACL 20-0.9 MEQ/L-% IV SOLN
INTRAVENOUS | Status: AC
  Administered 2016-08-29: 18:00:00 via INTRAVENOUS
  Filled 2016-08-29 (×2): qty 1000

## 2016-08-29 MED ORDER — MIRTAZAPINE 15 MG PO TBDP
15.0000 mg | ORAL_TABLET | Freq: Every day | ORAL | Status: DC
  Administered 2016-08-29 – 2016-08-31 (×3): 15 mg via ORAL
  Filled 2016-08-29 (×4): qty 1

## 2016-08-29 MED ORDER — POTASSIUM CHLORIDE 10 MEQ/100ML IV SOLN: 10.0000 meq | Freq: Once | INTRAVENOUS | Status: DC

## 2016-08-29 NOTE — Progress Notes (Signed)
Ref: Charolette Forward, MD   Subjective:  Wants to go home but has not ambulated. Needs significant assistance in moving from bed to chair. Poor oral intake.  Objective:  Vital Signs in the last 24 hours: Temp:  [98.2 F (36.8 C)-98.8 F (37.1 C)] 98.7 F (37.1 C) (09/30 1449) Pulse Rate:  [74-87] 87 (09/30 1449) Cardiac Rhythm: Normal sinus rhythm (09/30 0800) Resp:  [15-16] 16 (09/30 1449) BP: (92-116)/(54-88) 92/54 (09/30 1449) SpO2:  [99 %] 99 % (09/30 1449) Weight:  [59 kg (130 lb)] 59 kg (130 lb) (09/30 0542)  Physical Exam: BP Readings from Last 1 Encounters:  08/29/16 (!) 92/54    Wt Readings from Last 1 Encounters:  08/29/16 59 kg (130 lb)    Weight change: 7.568 kg (16 lb 10.9 oz)  HEENT: [place]/AT, Eyes-Brown, PERL, EOMI, Conjunctiva-Pale pink, Sclera-Non-icteric Neck: No JVD, No bruit, Trachea midline. Lungs:  Clear, Bilateral. Cardiac:  Regular rhythm, normal S1 and S2, no S3. II/VI systolic murmur. Abdomen:  Soft, non-tender. Extremities:  No edema present. No cyanosis. No clubbing. CNS: AxOx2, Cranial nerves grossly intact, moves all 4 extremities. Speaks slowly and softly. Skin: Warm and dry.   Intake/Output from previous day: 09/29 0701 - 09/30 0700 In: 1550 [P.O.:350; I.V.:1200] Out: 1275 [Urine:1275]    Lab Results: BMET    Component Value Date/Time   NA 137 08/29/2016 0404   NA 139 08/28/2016 0428   NA 137 08/27/2016 0417   K 3.4 (L) 08/29/2016 0404   K 2.9 (L) 08/28/2016 0428   K 3.4 (L) 08/27/2016 0417   CL 105 08/29/2016 0404   CL 107 08/28/2016 0428   CL 115 (H) 08/27/2016 0417   CO2 24 08/29/2016 0404   CO2 25 08/28/2016 0428   CO2 15 (L) 08/27/2016 0417   GLUCOSE 222 (H) 08/29/2016 0404   GLUCOSE 230 (H) 08/28/2016 0428   GLUCOSE 132 (H) 08/27/2016 0417   BUN 48 (H) 08/29/2016 0404   BUN 61 (H) 08/28/2016 0428   BUN 70 (H) 08/27/2016 0417   CREATININE 2.70 (H) 08/29/2016 0404   CREATININE 2.99 (H) 08/28/2016 0428   CREATININE 3.08  (H) 08/27/2016 0417   CALCIUM 6.7 (L) 08/29/2016 0404   CALCIUM 7.0 (L) 08/28/2016 0428   CALCIUM 7.7 (L) 08/27/2016 0417   GFRNONAA 17 (L) 08/29/2016 0404   GFRNONAA 15 (L) 08/28/2016 0428   GFRNONAA 15 (L) 08/27/2016 0417   GFRAA 20 (L) 08/29/2016 0404   GFRAA 17 (L) 08/28/2016 0428   GFRAA 17 (L) 08/27/2016 0417   CBC    Component Value Date/Time   WBC 12.6 (H) 08/29/2016 0404   RBC 3.92 08/29/2016 0404   HGB 11.0 (L) 08/29/2016 0404   HCT 34.1 (L) 08/29/2016 0404   PLT 140 (L) 08/29/2016 0404   MCV 87.0 08/29/2016 0404   MCH 28.1 08/29/2016 0404   MCHC 32.3 08/29/2016 0404   RDW 17.1 (H) 08/29/2016 0404   LYMPHSABS 1.9 08/23/2016 1834   MONOABS 0.4 08/23/2016 1834   EOSABS 0.0 08/23/2016 1834   BASOSABS 0.0 08/23/2016 1834   HEPATIC Function Panel  Recent Labs  08/23/16 2135 08/26/16 0410 08/27/16 0417  PROT 7.3 5.4* 5.3*   HEMOGLOBIN A1C No components found for: HGA1C,  MPG CARDIAC ENZYMES Lab Results  Component Value Date   CKTOTAL 54 01/05/2010   CKMB 3.2 01/05/2010   TROPONINI 0.02        NO INDICATION OF MYOCARDIAL INJURY. 12/30/2009   BNP No results for input(s):  PROBNP in the last 8760 hours. TSH No results for input(s): TSH in the last 8760 hours. CHOLESTEROL No results for input(s): CHOL in the last 8760 hours.  Scheduled Meds: . ciprofloxacin  250 mg Oral BID  . docusate sodium  100 mg Oral BID  . enoxaparin (LOVENOX) injection  30 mg Subcutaneous QHS  . exemestane  25 mg Oral QPC breakfast  . feeding supplement (ENSURE ENLIVE)  237 mL Oral BID BM  . feeding supplement (GLUCERNA SHAKE)  237 mL Oral TID BM  . folic acid  1 mg Oral Daily  . mirtazapine  15 mg Oral QHS  . multivitamin with minerals  1 tablet Oral Daily  . sodium bicarbonate  650 mg Oral TID  . sodium polystyrene  30 g Oral Once   Continuous Infusions: . 0.9 % NaCl with KCl 20 mEq / L     PRN Meds:.acetaminophen **OR** acetaminophen  Assessment/Plan: Metastatic left  breast cancer Acute renal failure E. Coli UTI Dehydration Severe protein calorie malnutrition DM, II Hypertension Hypokalemia Anemia of chronic disease and iron deficiency  Add Remeron to stimulate appetite. Increase activity as tolerated. Appreciate oncology assistance. Continue potassium supplement.   LOS: 6 days    Dixie Dials  MD  08/29/2016, 5:05 PM

## 2016-08-29 NOTE — Progress Notes (Signed)
Occupational Therapy Treatment Patient Details Name: Becky Pena MRN: [id number] DOB: [birth year] Today's Date: 08/29/2016    History of present illness 70 yo female adm with c/o abd pain; PMHx: DM;  CT chest and abdomen  positive for multiple pulmonary metastasis and locally invasive very large left breast cancer measuring 7 x 6 cm.; per chart pt has been limited with mobility for a "long time"   OT comments  Pt able to transfer to chair today with one person assist with encouragement and pillows in bottom of chair for comfort. Performed grooming seated. Pt instructed to use BSC and not bedpan or urinal. Pt very anxious with OOB mobility. Agreed to stay up in chair at least and hour. NT aware.  Follow Up Recommendations  SNF    Equipment Recommendations       Recommendations for Other Services      Precautions / Restrictions Precautions Precautions: Fall Restrictions Weight Bearing Restrictions: No       Mobility Bed Mobility Overal bed mobility: Needs Assistance Bed Mobility: Supine to Sit     Supine to sit: Min assist     General bed mobility comments: min assist with pad to bring hips to EOB  Transfers Overall transfer level: Needs assistance Equipment used: Rolling walker (2 wheeled) Transfers: Sit to/from [facility] Sit to Stand: Mod assist Stand pivot transfers: Min assist       General transfer comment: encouragement to get to chair    Balance Overall balance assessment: Needs assistance Sitting-balance support: Feet supported Sitting balance-Leahy Scale: Fair       Standing balance-Leahy Scale: Poor Standing balance comment: heavily reliant on UEs with walker                   ADL Overall ADL's : Needs assistance/impaired Eating/Feeding: Set up;Sitting Eating/Feeding Details (indicate cue type and reason): drinking water only, observed Grooming: Wash/dry hands;Wash/dry face;Sitting;Set up                                        Vision                 Additional Comments: L eye blind   Perception     Praxis      Cognition   Behavior During Therapy: Anxious Overall Cognitive Status: Within Functional Limits for tasks assessed                       Extremity/Trunk Assessment               Exercises     Shoulder Instructions       General Comments      Pertinent Vitals/ Pain       Pain Assessment: No/denies pain  Home Living                                          Prior Functioning/Environment              Frequency  Min 2X/week        Progress Toward Goals  OT Goals(current goals can now be found in the care plan section)  Progress towards OT goals: Progressing toward goals  Acute Rehab OT Goals Time For Goal Achievement: 09/08/16 Potential to Achieve Goals: Fair  Plan Discharge plan remains appropriate    Co-evaluation                 End of Session Equipment Utilized During Treatment: Rolling walker;Gait belt   Activity Tolerance Patient tolerated treatment well   Patient Left in chair;with call bell/phone within reach;with chair alarm set   Nurse Communication Mobility status        Time: 1445-1516 OT Time Calculation (min): 31 min  Charges: OT General Charges $OT Visit: 1 Procedure OT Treatments $Self Care/Home Management : 8-22 mins $Therapeutic Activity: 8-22 mins  Malka So 08/29/2016, 3:39 PM  [phone]

## 2016-08-29 NOTE — Progress Notes (Signed)
Nursing Note: Pt weighed this am =130 lbs.Weight yesterday 's weight 133 lbs.A: Paged on-call.No obvious edema noted.intake this shift 1200 cc.Output 625 cc.R: Walden Field ,Np Returned page and states she will report.No distress noted.No change noted.wbb

## 2016-08-30 LAB — BASIC METABOLIC PANEL
ANION GAP: 7 (ref 5–15)
BUN: 34 mg/dL — ABNORMAL HIGH (ref 6–20)
CALCIUM: 6.8 mg/dL — AB (ref 8.9–10.3)
CO2: 24 mmol/L (ref 22–32)
Chloride: 107 mmol/L (ref 101–111)
Creatinine, Ser: 2.38 mg/dL — ABNORMAL HIGH (ref 0.44–1.00)
GFR, EST AFRICAN AMERICAN: 23 mL/min — AB (ref >60)
GFR, EST NON AFRICAN AMERICAN: 20 mL/min — AB (ref >60)
GLUCOSE: 139 mg/dL — AB (ref 65–99)
Potassium: 3.5 mmol/L (ref 3.5–5.1)
Sodium: 138 mmol/L (ref 135–145)

## 2016-08-30 NOTE — Progress Notes (Signed)
Ref: Charolette Forward, MD   Subjective:  Awake. More alert. Agrees to walk if gets shoes from home.  Objective:  Vital Signs in the last 24 hours: Temp:  [98 F (36.7 C)-98.7 F (37.1 C)] 98.6 F (37 C) (10/01 0545) Pulse Rate:  [63-87] 63 (10/01 0545) Resp:  [16] 16 (10/01 0545) BP: (92-109)/(54-65) 109/64 (10/01 0545) SpO2:  [97 %-99 %] 97 % (10/01 0545)  Physical Exam: BP Readings from Last 1 Encounters:  08/30/16 109/64    Wt Readings from Last 1 Encounters:  08/29/16 59 kg (130 lb)    Weight change:   HEENT: [place]/AT, Eyes-Brown, PERL, EOMI, Conjunctiva-Pale pink, Sclera-Non-icteric Neck: No JVD, No bruit, Trachea midline. Lungs:  Clear, Bilateral. Cardiac:  Regular rhythm, normal S1 and S2, no S3. II/VI systolic murmur. Abdomen:  Soft, non-tender. Extremities:  No edema present. No cyanosis. No clubbing. CNS: AxOx3, Cranial nerves grossly intact, moves all 4 extremities. Improved speech. Skin: Warm and dry.   Intake/Output from previous day: 09/30 0701 - 10/01 0700 In: 1840 [P.O.:840; I.V.:1000] Out: 801 [Urine:800; Stool:1]    Lab Results: BMET    Component Value Date/Time   NA 138 08/30/2016 0402   NA 137 08/29/2016 0404   NA 139 08/28/2016 0428   K 3.5 08/30/2016 0402   K 3.4 (L) 08/29/2016 0404   K 2.9 (L) 08/28/2016 0428   CL 107 08/30/2016 0402   CL 105 08/29/2016 0404   CL 107 08/28/2016 0428   CO2 24 08/30/2016 0402   CO2 24 08/29/2016 0404   CO2 25 08/28/2016 0428   GLUCOSE 139 (H) 08/30/2016 0402   GLUCOSE 222 (H) 08/29/2016 0404   GLUCOSE 230 (H) 08/28/2016 0428   BUN 34 (H) 08/30/2016 0402   BUN 48 (H) 08/29/2016 0404   BUN 61 (H) 08/28/2016 0428   CREATININE 2.38 (H) 08/30/2016 0402   CREATININE 2.70 (H) 08/29/2016 0404   CREATININE 2.99 (H) 08/28/2016 0428   CALCIUM 6.8 (L) 08/30/2016 0402   CALCIUM 6.7 (L) 08/29/2016 0404   CALCIUM 7.0 (L) 08/28/2016 0428   GFRNONAA 20 (L) 08/30/2016 0402   GFRNONAA 17 (L) 08/29/2016 0404   GFRNONAA 15 (L) 08/28/2016 0428   GFRAA 23 (L) 08/30/2016 0402   GFRAA 20 (L) 08/29/2016 0404   GFRAA 17 (L) 08/28/2016 0428   CBC    Component Value Date/Time   WBC 12.6 (H) 08/29/2016 0404   RBC 3.92 08/29/2016 0404   HGB 11.0 (L) 08/29/2016 0404   HCT 34.1 (L) 08/29/2016 0404   PLT 140 (L) 08/29/2016 0404   MCV 87.0 08/29/2016 0404   MCH 28.1 08/29/2016 0404   MCHC 32.3 08/29/2016 0404   RDW 17.1 (H) 08/29/2016 0404   LYMPHSABS 1.9 08/23/2016 1834   MONOABS 0.4 08/23/2016 1834   EOSABS 0.0 08/23/2016 1834   BASOSABS 0.0 08/23/2016 1834   HEPATIC Function Panel  Recent Labs  08/23/16 2135 08/26/16 0410 08/27/16 0417  PROT 7.3 5.4* 5.3*   HEMOGLOBIN A1C No components found for: HGA1C,  MPG CARDIAC ENZYMES Lab Results  Component Value Date   CKTOTAL 54 01/05/2010   CKMB 3.2 01/05/2010   TROPONINI 0.02        NO INDICATION OF MYOCARDIAL INJURY. 12/30/2009   BNP No results for input(s): PROBNP in the last 8760 hours. TSH No results for input(s): TSH in the last 8760 hours. CHOLESTEROL No results for input(s): CHOL in the last 8760 hours.  Scheduled Meds: . ciprofloxacin  250 mg Oral BID  .  docusate sodium  100 mg Oral BID  . enoxaparin (LOVENOX) injection  30 mg Subcutaneous QHS  . exemestane  25 mg Oral QPC breakfast  . feeding supplement (ENSURE ENLIVE)  237 mL Oral BID BM  . feeding supplement (GLUCERNA SHAKE)  237 mL Oral TID BM  . folic acid  1 mg Oral Daily  . mirtazapine  15 mg Oral QHS  . multivitamin with minerals  1 tablet Oral Daily  . sodium bicarbonate  650 mg Oral TID  . sodium polystyrene  30 g Oral Once   Continuous Infusions: . 0.9 % NaCl with KCl 20 mEq / L 75 mL/hr at 08/29/16 1815   PRN Meds:.acetaminophen **OR** acetaminophen  Assessment/Plan: Metastatic left breast cancer Acute renal failure E. Coli UTI Dehydration Severe protein calorie malnutrition DM, II Hypertension Hypokalemia resolved Anemia of chronic disease and  iron deficiency  Continue encouragement in Feeding and ambulation.    LOS: 7 days    Dixie Dials  MD  08/30/2016, 1:42 PM

## 2016-08-30 NOTE — Progress Notes (Signed)
Patient able to ambulate to bathroom with walker and 1 assist,needs a lot of encouragement to be able to do some ADLS,Patient had a hard time getting up from toilet , claims she cant do it.needed to person assist to get up from toilet.Still not eating.

## 2016-08-30 NOTE — Plan of Care (Signed)
Problem: Activity: Goal: Risk for activity intolerance will decrease Outcome: Not Progressing Pt is 2 assist for transfers and requires a lot of encouragement to actively assist in the transfer. Also needs a lot of encouragement to get OOB to [facility].

## 2016-08-31 DIAGNOSIS — R531 Weakness: Secondary | ICD-10-CM

## 2016-08-31 DIAGNOSIS — C7951 Secondary malignant neoplasm of bone: Secondary | ICD-10-CM

## 2016-08-31 NOTE — Progress Notes (Signed)
Physical Therapy Treatment Patient Details Name: Becky Pena MRN: [id number] DOB: 1946/05/03 Today's Date: 08/31/2016    History of Present Illness 70 yo female adm with c/o abd pain; PMHx: DM;  CT chest and abdomen  positive for multiple pulmonary metastasis and locally invasive very large left breast cancer measuring 7 x 6 cm.; per chart pt has been limited with mobility for a "long time"    PT Comments    Pt progressing with mobility, she ambulated 31' with RW and min/guard assist today. Noted minor abrasions on L toes ( IP joints have flexion deformity, appears they rubbed on shoes) and small skin tear dorsum of foot, RN notified.   Follow Up Recommendations  SNF     Equipment Recommendations  None recommended by PT    Recommendations for Other Services       Precautions / Restrictions Precautions Precautions: Fall Restrictions Weight Bearing Restrictions: No    Mobility  Bed Mobility Overal bed mobility: Needs Assistance Bed Mobility: Supine to Sit     Supine to sit: Supervision;HOB elevated     General bed mobility comments: HOB up 35*, used rail, VCs to scoot to EOB, no physical assist needed  Transfers Overall transfer level: Needs assistance Equipment used: Rolling walker (2 wheeled) Transfers: Sit to/from Stand Sit to Stand: Mod assist;From elevated surface Stand pivot transfers: Min guard       General transfer comment: assist to rise, increased time  Ambulation/Gait Ambulation/Gait assistance: Min guard Ambulation Distance (Feet): 16 Feet Assistive device: Rolling walker (2 wheeled) Gait Pattern/deviations: Step-through pattern;Decreased step length - right;Decreased step length - left   Gait velocity interpretation: Below normal speed for age/gender General Gait Details: steady with RW, distance limited by fatigue   Stairs            Wheelchair Mobility    Modified Rankin (Stroke Patients Only)       Balance     Sitting  balance-Leahy Scale: Fair       Standing balance-Leahy Scale: Poor                      Cognition Arousal/Alertness: Awake/alert Behavior During Therapy: WFL for tasks assessed/performed Overall Cognitive Status: Within Functional Limits for tasks assessed                      Exercises      General Comments        Pertinent Vitals/Pain Pain Assessment: No/denies pain    Home Living                      Prior Function            PT Goals (current goals can now be found in the care plan section) Acute Rehab PT Goals Patient Stated Goal: to play keyboard PT Goal Formulation: With patient Time For Goal Achievement: 09/08/16 Potential to Achieve Goals: Fair Progress towards PT goals: Progressing toward goals    Frequency    Min 3X/week      PT Plan Current plan remains appropriate    Co-evaluation             End of Session Equipment Utilized During Treatment: Gait belt Activity Tolerance: Patient tolerated treatment well Patient left: with call bell/phone within reach;in chair;with chair alarm set     Time: 1040-1057 PT Time Calculation (min) (ACUTE ONLY): 17 min  Charges:  $Gait Training: 8-22 mins  G Codes:      Blondell Reveal Kistler 08/31/2016, 11:04 AM [phone]

## 2016-08-31 NOTE — Progress Notes (Signed)
Pt to DC home with family. Pt states she would like some HH services when she discharges. Need MD orders for HHPT/RN/Aide. Choice offered for [facility] services and [facility] was chosen. [facility] rep contacted for referral. Pt states she has RW and BSC at home already. Marney Doctor RN,BSN,NCM [phone]

## 2016-08-31 NOTE — Progress Notes (Signed)
Patient with maximum assistance was able to get to [facility] tonight. NT reported that patient basically was only able to pivot to [facility].  It took two of [place] to get her back in the bed from [facility].  She was not able to stand up straight without her legs almost giving out.  She also had an episode of incontinence prior to getting to the [facility].  She is very weak and unwilling to put in the effort to ambulate.  She also was reluctant to take her oral medications. After several minutes and encouragement, patient finally agreed to take medications with a coca cola.  Will continue to encourage and educate patient.Azzie Glatter [place]

## 2016-08-31 NOTE — Progress Notes (Signed)
Nutrition Follow-up  DOCUMENTATION CODES:   Severe malnutrition in context of chronic illness  INTERVENTION:   Continue Ensure Enlive po BID, each supplement provides 350 kcal and 20 grams of protein Encourage PO intake RD to continue to monitor  NUTRITION DIAGNOSIS:   Malnutrition related to chronic illness as evidenced by energy intake < or equal to 75% for > or equal to 1 month, severe depletion of body fat, severe depletion of muscle mass.  Ongoing.  GOAL:   Patient will meet greater than or equal to 90% of their needs  Progressing.  MONITOR:   PO intake, Supplement acceptance, Labs, Weight trends, Skin, I & O's  ASSESSMENT:   69 years old female with history of diabetes has lower abdominal pain and hurting on urination. She had also noticed lump in left breast as early as March, 2017. She claims she did not had money to see a doctor. 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 Left side of neck is also thick like a cord. Her renal function has also deteriorated with creatinine of 4.14. Her oral intake has been poor and she has not ambulated for long time per family.   Patient in room eating breakfast, pt states her appetite is improved. She states she feels slightly better. Pt already consumed 1/2 of her grits and was working on a bagel. PO intakes have ranged from 25-100% in the past 24 hours. Pt continues to receive Glucerna shakes, multiple cans in room at time of visit. Pt states she prefers Ensure supplements. Will d/c Glucerna order.  Pt's weight is +21 lb since admission 9/24.  Medications: Colace tablet BID, Folic acid tablet daily, Remeron tablet daily, Multivitamin with minerals daily,  Labs reviewed: Low Mg Glucose 139  Diet Order:  Diet regular Room service appropriate? Yes; Fluid consistency: Thin  Skin:  Wound (see comment) (9/28 L Breast incision)  Last BM:   10/1  Height:   Ht Readings from Last 1 Encounters:  08/24/16 5\' 4"  (1.626 m)    Weight:   Wt Readings from Last 1 Encounters:  08/31/16 137 lb (62.1 kg)    Ideal Body Weight:  54.5 kg  BMI:  Body mass index is 23.52 kg/m.  Estimated Nutritional Needs:   Kcal:  1350-1550  Protein:  60-70g  Fluid:  1.5L/day  EDUCATION NEEDS:   No education needs identified at this time  Clayton Bibles, MS, RD, LDN Pager: [phone] After Hours Pager: [phone]

## 2016-08-31 NOTE — Progress Notes (Signed)
Ms. Agricola sounds a lot better this morning. She is still very weak. I know that the nurses are working with her to try to get her strength better. She really cannot walk on her own. It is hard to say how much physical therapy will be able to help but clearly, she will not be able to go home as she cannot manage herself.  She now is on Aromasin. I do not have the results of the biopsy back yet but I am making the assumption that this is metastatic breast cancer and that it is estrogen receptor positive. The odds are clearly in her favor that it is estrogen positive.  It seemed like her appetite might be a little bit better. She's had no nausea or vomiting.  Her renal function is improving. Her creatinine yesterday was 2.38. This is coming down nicely. Her hemoglobin was 11 over the weekend. No labs are back yet today.  She has had no diarrhea. She has had no fever.  On her physical exam, her vital signs are all stable. Her blood pressures 120/88. Her temperature is 98.6. Pulse is 85. Her lungs are clear bilaterally. Cardiac exam regular rate and rhythm with no murmurs, rubs or bruits. Abdomen is soft. She has good bowel sounds. There is no fluid wave. There is no palpable liver or spleen tip. Extremities shows some symmetric muscle atropine upper and lower extremities. Neurological exam shows no focal neurological deficits.  Again, I have to believe that Ms. Borski has metastatic breast cancer. The biopsy was done on Thursday. We should have some results back today to let us know with the histology is. If this is breast cancer, I would not expect to have any prognostic markers back until later on this week at the earliest. I would still keep her on the Aromasin.  I still cannot use bisphosphonate therapy. I will like to try Xgeva which, I do not think that we can do with her as an inpatient.  She also probably needs to have a PET scan done. 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 I cannot explain this divergent report.  I am just glad that her quality of life might be a better. She seems to be sounding stronger.  It is obvious that the staff on [address] are doing a great job with her. I appreciate all of your care!!!  Lattie Haw, MD  [address]:3

## 2016-08-31 NOTE — Progress Notes (Signed)
Subjective:  Patient denies any chest pain or shortness of breath. Discussed with patient again regarding skilled nursing facility/rehabilitation but adamant to go home  Objective:  Vital Signs in the last 24 hours: Temp:  [98.4 F (36.9 C)-98.6 F (37 C)] 98.6 F (37 C) (10/02 0458) Pulse Rate:  [85-95] 85 (10/02 0458) Resp:  [14-16] 14 (10/02 0458) BP: (93-120)/(65-88) 120/88 (10/02 0458) SpO2:  [100 %] 100 % (10/02 0458) Weight:  [137 lb (62.1 kg)] 137 lb (62.1 kg) (10/02 0620)  Intake/Output from previous day: 10/01 0701 - 10/02 0700 In: 1722 [P.O.:822; I.V.:900] Out: 475 [Urine:475] Intake/Output from this shift: Total I/O In: 240 [P.O.:240] Out: 225 [Urine:225]  Physical Exam: Neck: no adenopathy, no carotid bruit, no JVD and supple, symmetrical, trachea midline Lungs: clear to auscultation bilaterally Heart: regular rate and rhythm, S1, S2 normal and Soft systolic murmur noted Abdomen: soft, non-tender; bowel sounds normal; no masses,  no organomegaly Extremities: extremities normal, atraumatic, no cyanosis or edema  Lab Results:  Recent Labs  08/29/16 0404  WBC 12.6*  HGB 11.0*  PLT 140*    Recent Labs  08/29/16 0404 08/30/16 0402  NA 137 138  K 3.4* 3.5  CL 105 107  CO2 24 24  GLUCOSE 222* 139*  BUN 48* 34*  CREATININE 2.70* 2.38*   No results for input(s): TROPONINI in the last 72 hours.  Invalid input(s): CK, MB Hepatic Function Panel No results for input(s): PROT, ALBUMIN, AST, ALT, ALKPHOS, BILITOT, BILIDIR, IBILI in the last 72 hours. No results for input(s): CHOL in the last 72 hours. No results for input(s): PROTIME in the last 72 hours.  Imaging: Imaging results have been reviewed and No results found.  Cardiac Studies:  Assessment/Plan:  Metastatic CA of the left breast Resolving Acute renal failure Bilateral hydronephrosis questionable etiology Escherichia coli UTI Dehydration Severe protein calorie malnutrition Diabetes  mellitus Hypertension. Anemia of chronic disease Plan DC [facility] Social service for discharge planning Increase ambulation as tolerated   LOS: 8 days    Charolette Forward 08/31/2016, 12:54 PM

## 2016-08-31 NOTE — Progress Notes (Signed)
CSW consulted for SNF placement. PN reviewed. MD notes pt continues to decline SNF placement. Pt was seen by CSW on 08/28/16 and refused SNF placement. RNCM will assist with d/c planning to home.  Werner Lean LCSW [phone]

## 2016-09-01 MED ORDER — ADULT MULTIVITAMIN W/MINERALS CH: 1.0000 | ORAL_TABLET | Freq: Every day | ORAL | 3 refills | Status: AC

## 2016-09-01 MED ORDER — EXEMESTANE 25 MG PO TABS: 25.0000 mg | ORAL_TABLET | Freq: Every day | ORAL | 3 refills | Status: DC

## 2016-09-01 MED ORDER — MIRTAZAPINE 15 MG PO TBDP: 15.0000 mg | ORAL_TABLET | Freq: Every day | ORAL | 3 refills | Status: AC

## 2016-09-01 MED ORDER — ENSURE ENLIVE PO LIQD: 237.0000 mL | Freq: Two times a day (BID) | ORAL | 12 refills | Status: AC

## 2016-09-01 MED ORDER — SODIUM BICARBONATE 650 MG PO TABS: 650.0000 mg | ORAL_TABLET | Freq: Two times a day (BID) | ORAL | 3 refills | Status: AC

## 2016-09-01 MED ORDER — FOLIC ACID 1 MG PO TABS: 1.0000 mg | ORAL_TABLET | Freq: Every day | ORAL | 3 refills | Status: AC

## 2016-09-01 NOTE — Discharge Summary (Signed)
Discharge summary dictated on 09/01/2016 dictation number is [phone]

## 2016-09-01 NOTE — Progress Notes (Signed)
Nursing Discharge Summary  Patient ID: Emberleigh Thapa MRN: [id number] DOB/AGE: [birth year] 70 y.o.  Admit date: 08/23/2016 Discharge date: 09/01/2016  Discharged Condition: good  Disposition: 01-Home or Self Care  Follow-up Information    Charolette Forward, MD. Call in 2 week(s).   Specialty:  Cardiology Contact information: [place]  [address] [phone]           Prescriptions Given: Patient given prescription for multivitamin and follow up appointments and medications discussed.    Means of Discharge: Patient to be transported home via [facility].    Signed: Buel Ream 09/01/2016, 5:06 PM

## 2016-09-01 NOTE — Progress Notes (Signed)
Pt may potentially need [facility] transport home. Medical necessity form filled out and printed along with face sheet. RN given forms and [facility] number. Marney Doctor RN,BSN,NCM [phone]

## 2016-09-01 NOTE — Progress Notes (Signed)
Occupational Therapy Treatment Patient Details Name: Becky Pena MRN: [id number] DOB: [birth year] Today's Date: 09/01/2016    History of present illness 70 yo female adm with c/o abd pain; PMHx: DM;  CT chest and abdomen  positive for multiple pulmonary metastasis and locally invasive very large left breast cancer measuring 7 x 6 cm.; per chart pt has been limited with mobility for a "long time"   OT comments  Progressing slowly towards OT goals. Patient required MAX A to/from [facility] during OT session. Her only help at home is her 47yo mother. Do not feel d/c home is safe at this time. COntinue to recommend SNF.   Follow Up Recommendations  SNF    Equipment Recommendations  3 in 1 bedside comode    Recommendations for Other Services      Precautions / Restrictions Precautions Precautions: Fall Restrictions Weight Bearing Restrictions: No       Mobility Bed Mobility Overal bed mobility: Needs Assistance Bed Mobility: Supine to Sit;Sit to Supine     Supine to sit: Mod assist Sit to supine: Mod assist   General bed mobility comments: assistance for LEs on/off bed, trunk elevation  Transfers Overall transfer level: Needs assistance Equipment used: Rolling walker (2 wheeled) Transfers: Sit to/from [employer] Transfers Sit to Stand: Max assist;From elevated surface Stand pivot transfers: Mod assist;Max assist       General transfer comment: assist to rise, stabilize, increased time    Balance                                   ADL Overall ADL's : Needs assistance/impaired Eating/Feeding: Set up;Bed level   Grooming: Wash/dry hands;Set up;Sitting                 Lower Body Dressing Details (indicate cue type and reason): donned slip on shoes with min A at EOB Toilet Transfer: Maximal assistance;Stand-pivot;BSC;RW   Toileting- Clothing Manipulation and Hygiene: Set up;Supervision/safety;Sitting/lateral lean Toileting - Clothing  Manipulation Details (indicate cue type and reason): hygiene only     Functional mobility during ADLs: Maximal assistance;Rolling walker General ADL Comments: Patient required max A stand pivot to [facility] with RW; unable to stand from [facility] so did squat pivot back to bed with max A.      Vision                     Perception     Praxis      Cognition   Behavior During Therapy: [facility] for tasks assessed/performed Overall Cognitive Status: No family/caregiver present to determine baseline cognitive functioning Area of Impairment: Safety/judgement;Problem solving          Safety/Judgement: Decreased awareness of deficits   Problem Solving: Decreased initiation;Requires verbal cues      Extremity/Trunk Assessment               Exercises     Shoulder Instructions       General Comments      Pertinent Vitals/ Pain       Pain Assessment: No/denies pain  Home Living                                          Prior Functioning/Environment              Frequency  Min 2X/week        Progress Toward Goals  OT Goals(current goals can now be found in the care plan section)  Progress towards OT goals: Progressing toward goals  Acute Rehab OT Goals Patient Stated Goal: to play keyboard  Plan Discharge plan remains appropriate    Co-evaluation                 End of Session Equipment Utilized During Treatment: Rolling walker;Gait belt   Activity Tolerance Patient tolerated treatment well   Patient Left in bed;with call bell/phone within reach;with bed alarm set   Nurse Communication          Time: [id number] OT Time Calculation (min): 23 min  Charges: OT General Charges $OT Visit: 1 Procedure OT Treatments $Self Care/Home Management : 8-22 mins  Minnette Merida A 09/01/2016, 12:37 PM

## 2016-09-01 NOTE — Discharge Instructions (Signed)
Acute Kidney Injury °Acute kidney injury is any condition in which there is sudden (acute) damage to the kidneys. Acute kidney injury was previously known as acute kidney failure or acute renal failure. The kidneys are two organs that lie on either side of the spine between the middle of the back and the front of the abdomen. The kidneys: °· Remove wastes and extra water from the blood.   °· Produce important hormones. These help keep bones strong, regulate blood pressure, and help create red blood cells.   °· Balance the fluids and chemicals in the blood and tissues. °A small amount of kidney damage may not cause problems, but a large amount of damage may make it difficult or impossible for the kidneys to work the way they should. Acute kidney injury may develop into long-lasting (chronic) kidney disease. It may also develop into a life-threatening disease called end-stage kidney disease. Acute kidney injury can get worse very quickly, so it should be treated right away. Early treatment may prevent other kidney diseases from developing. °CAUSES  °· A problem with blood flow to the kidneys. This may be caused by:   °¨ Blood loss.   °¨ Heart disease.   °¨ Severe burns.   °¨ Liver disease. °· Direct damage to the kidneys. This may be caused by: °¨ Some medicines.   °¨ A kidney infection.   °¨ Poisoning or consuming toxic substances.   °¨ A surgical wound.   °¨ A blow to the kidney area.   °· A problem with urine flow. This may be caused by:   °¨ Cancer.   °¨ Kidney stones.   °¨ An enlarged prostate. °SIGNS AND SYMPTOMS  °· Swelling (edema) of the legs, ankles, or feet.   °· Tiredness (lethargy).   °· Nausea or vomiting.   °· Confusion.   °· Problems with urination, such as:   °¨ Painful or burning feeling during urination.   °¨ Decreased urine production.   °¨ Frequent accidents in children who are potty trained.   °¨ Bloody urine.   °· Muscle twitches and cramps.   °· Shortness of breath.   °· Seizures.   °· Chest  pain or pressure. °Sometimes, no symptoms are present.  °DIAGNOSIS °Acute kidney injury may be detected and diagnosed by tests, including blood, urine, imaging, or kidney biopsy tests.  °TREATMENT °Treatment of acute kidney injury varies depending on the cause and severity of the kidney damage. In mild cases, no treatment may be needed. The kidneys may heal on their own. If acute kidney injury is more severe, your health care provider will treat the cause of the kidney damage, help the kidneys heal, and prevent complications from occurring. Severe cases may require a procedure to remove toxic wastes from the body (dialysis) or surgery to repair kidney damage. Surgery may involve:  °· Repair of a torn kidney.   °· Removal of an obstruction. °HOME CARE INSTRUCTIONS °· Follow your prescribed diet. °· Take medicines only as directed by your health care provider.  °· Do not take any new medicines (prescription, over-the-counter, or nutritional supplements) unless approved by your health care provider. Many medicines can worsen your kidney damage or may need to have the dose adjusted.   °· Keep all follow-up visits as directed by your health care provider. This is important. °· Observe your condition to make sure you are healing as expected. °SEEK IMMEDIATE MEDICAL CARE IF: °· You are feeling ill or have severe pain in the back or side.   °· Your symptoms return or you have new symptoms. °· You have any symptoms of end-stage kidney disease. These include:   °¨ Persistent itchiness.   °¨   Loss of appetite.   Headaches.   Abnormally dark or light skin.  Numbness in the hands or feet.   Easy bruising.   Frequent hiccups.   Menstruation stops.   You have a fever.  You have increased urine production.  You have pain or bleeding when urinating. MAKE SURE YOU:   Understand these instructions.  Will watch your condition.  Will get help right away if you are not doing well or get worse.   This  information is not intended to replace advice given to you by your health care provider. Make sure you discuss any questions you have with your health care provider.   Document Released: 06/01/2011 Document Revised: 12/07/2014 Document Reviewed: 07/15/2012 Elsevier Interactive Patient Education 2016 Elsevier Inc[employer]  Bone Metastasis Bone metastasis is cancer that spreads to the bones from another part of the body. A person may have bone metastasis in one bone or in more than one bone. Cancer that spreads to the bones is different from cancer that starts in the bones (primary bone cancer). Bone metastasis is more common than primary bone cancer. The spine is the most common area for bone metastasis. Other common areas include:  Hip bone (pelvis).  Ribs.  Skull.  Long bones of the arm or leg. Bone metastasis is painful, and it damages the bones. Bone metastasis damages and weakens bones in two ways. A person may have bone destruction (osteolytic damage) or abnormal bone growth (osteoblastic destruction). Both of these conditions can make bones so weak that they break (pathologic fracture) even from a minor injury. CAUSES This condition is caused by cancer cells that spread to bone. These cells can get into your bloodstream and spread through your body. They can also get into the vessels that are part of your lymphatic system (lymph vessels) and spread that way. RISK FACTORS This condition is more likely to develop in people who have an advanced type of cancer that is known to spread to bone. Cancers that often spread to bone include:  Breast cancer.  Prostate cancer.  Lung cancer.  Thyroid cancer.  Kidney cancer. SYMPTOMS The most common symptom of this condition is bone pain, especially while you are resting. Other symptoms include:  A broken bone (fracture) that happens with little or no trauma.  Low number of red blood cells (anemia). Bone destruction may damage the spongy tissue  (bone marrow) in the center of some bones where red blood cells are produced. Anemia can cause:  Weakness.  Shortness of breath.  Headache.  Dizziness.  Back or neck pain with numbness or weakness, especially if you have bone metastasis in your spine.  High levels of calcium in your blood (hypercalcemia). When bone is destroyed, calcium is released into your blood. Symptoms of hypercalcemia include:  Constipation.  Thirst.  Nausea.  Sleepiness. DIAGNOSIS This condition may be diagnosed based on:  Your symptoms and medical history. Your health care provider may suspect this condition if you are being treated for cancer or have had cancer treatment in the past.  A physical exam.  Imaging studies, such as:  Bone X-rays, especially in the area where you have pain.  CT scan.  Bone scan.  MRI.  Blood tests to check for anemia or hypercalcemia.  A procedure to remove a piece of bone so it can be examined under a microscope (biopsy). TREATMENT Treatment for this condition depends on your overall health, the type of cancer you have, and how much the cancer has spread.  You will work with a team of health care providers to determine which treatment is best for you. Treatment will focus on managing pain, preventing bone weakness, and slowing the spread of the cancer. Treatment may include:  Radiation therapy. This treatment uses X-rays to kill cancer cells. It is most effective for reducing pain, stopping tumor growth, and lowering the risk of fractures.  Radioisotope therapy. This treatment uses a radioactive medicine that is injected into your blood. The medicine travels to areas where cancer cells are active and kills them.  Chemotherapy. For this treatment, you are given cancer-killing drugs. You may have chemotherapy in cycles, with rest periods in between.  Medicines to block cells that destroy bone (bisphosphonates and denosumab). These medicines are used to control bone  pain. They may help to reduce hypercalcemia.  Medicines to reduce pain (opiates).  Endocrine therapies. These therapies slow cancer growth by blocking specific chemical messengers (hormones). Some types of cancer, including breast and prostate cancers, depend on hormones.  Targeted therapies. These therapies involve the use of drugs that block the growth and spread of cancer. This treatment is different from standard chemotherapy.  Immunotherapies. These therapies use the body's defense system (immune system) to fight cancer cells.  Surgery. You may have surgery to remove bone cancer or to prevent or repair a fracture. HOME CARE INSTRUCTIONS  Take medicines only as directed by your health care provider.  Do not drive or operate heavy machinery while taking pain medicine.  Take the following steps to help prevent constipation, which can result from some pain medicines.  Include plenty of fruits and whole grains in your diet.  Drink enough fluid to keep your urine clear or pale yellow.  Ask your health care provider about taking a laxative if needed.  Keep all follow-up visits as directed by your health care provider. This is important. SEEK MEDICAL CARE IF:  Your pain medicine is not helping.  You are too weak to care for yourself at home. SEEK IMMEDIATE MEDICAL CARE IF:  You fall and have pain or an injury.  You have trouble walking.  You have numbness or tingling in your legs.  Your pain suddenly gets worse.  You lose control of your bowels or your bladder.  You are very sleepy or confused.   This information is not intended to replace advice given to you by your health care provider. Make sure you discuss any questions you have with your health care provider.   Document Released: 11/06/2002 Document Revised: 04/02/2015 Document Reviewed: 09/19/2014 Elsevier Interactive Patient Education 2016 Elsevier Inc.  bilateral hydronephrosis Status post Escherichia coli  UTI

## 2016-09-02 ENCOUNTER — Encounter: Payer: Self-pay | Admitting: Hematology & Oncology

## 2016-09-02 ENCOUNTER — Other Ambulatory Visit: Payer: Self-pay | Admitting: Hematology & Oncology

## 2016-09-02 DIAGNOSIS — C50912 Malignant neoplasm of unspecified site of left female breast: Secondary | ICD-10-CM | POA: Insufficient documentation

## 2016-09-02 HISTORY — DX: Malignant neoplasm of unspecified site of left female breast: C50.912

## 2016-09-02 NOTE — Discharge Summary (Signed)
NAME:  Becky, Pena               ACCOUNT NO.:  [id number]  MEDICAL RECORD NO.:  [id number]  LOCATION:  67                         FACILITY:  [facility]  PHYSICIAN:  Emmanuelle Hibbitts N. Terrence Dupont, M.D. DATE OF BIRTH:  1946/06/08  DATE OF ADMISSION:  08/23/2016 DATE OF DISCHARGE:  09/01/2016                              DISCHARGE SUMMARY   ADMITTING DIAGNOSES: 1. Acute renal failure, acute urinary tract infection. 2. Dehydration. 3. Left breast mass. 4. Severe protein-calorie malnutrition. 5. Diabetes mellitus. 6. Hyperkalemia.  FINAL DIAGNOSES: 1. Metastatic carcinoma of left breast. 2. Resolving acute renal failure. 3. Resolving bilateral hydronephrosis. 4. Status post Escherichia coli urinary tract infection. 5. Status post dehydration. 6. Severe protein-calorie malnutrition. 7. Diabetes mellitus, controlled by diet. 8. Hypertension, controlled by diet. 9. Anemia of chronic disease.  DISCHARGE HOME MEDICATIONS: 1. Aromasin 25 mg 1 tablet daily after breakfast. 2. Ensure 237 mL twice daily. 3. Folic acid 1 mg daily. 4. Remeron 15 mg 1 tablet daily at night. 5. Multivitamin with minerals 1 tablet daily. 6. Sodium bicarbonate 650 mg 1 tablet twice daily. 7. Aspirin 81 mg 1 tablet daily. 8. The patient advised to stop glimepiride.  DIET:  Low-salt, low-cholesterol, 1800 calories ADA diet.  FOLLOWUP:  Follow up with me in 2 weeks.  Follow up with Dr. Rowe Pavy[facility] in 2 weeks as scheduled.  Home health aide, RN, PT has been arranged.  The patient will be scheduled for outpatient PET scan.  CONDITION AT DISCHARGE:  Stable.  BRIEF HISTORY AND HOSPITAL COURSE:  [name] is a 70 year old female with past medical history significant for diabetes mellitus, hypertension, chronic kidney disease, came to the emergency room at [facility] complaining of lower abdominal pain associated with dysuria, also noticed lump in the breast as early as March of 2017.  She claims she  did not have money to see the doctors.  CT scan of the chest and abdomen is positive for multiple pulmonary metastasis and locally invasive very large left breast cancer measuring 7 x 6 cm.  Left side of the neck is also thick like a card.  Her renal functions were also deteriorated with creatinine of 4.14.  Her oral intake has been poor. She has not ambulated for a long time per family.  PHYSICAL EXAMINATION:  VITAL SIGNS: Her blood pressure was 107/69, pulse 79, temperature was 98.6. GENERAL APPEARANCE:  Cooperative, appears older than stated age, cachectic, in mild distress. HEENT:  Head is normocephalic, atraumatic.  Conjunctivae were pale. Cornea, left eye was opaque.  Sclerae nonicteric. NECK:  Left cervical lymph nodes were large and matted.  No carotid bruit.  No JVD. LUNGS:  Clear to auscultation bilaterally.  Left breast large 6 x 7 cm in the upper and outer quadrant with thickening of the skin and inversion of the nipple. CARDIOVASCULAR:  Regular rate and rhythm.  S1, S2 was normal.  There was 2/6 systolic murmur.  No click, rub, or gallop. ABDOMEN:  Soft.  Bowel sounds present.  Nontender.  No organomegaly. EXTREMITIES:  There was no clubbing, cyanosis, or edema.  Lymph nodes were palpable in left cervical region and left axillary nodes. NEUROLOGIC:  Grossly intact.  LABORATORY DATA:  Her CA 27-29 was 346.  Urine culture showed E coli, which was pan sensitive.  CT of the chest showed large mass arising from the left breast, which invaded the left chest wall musculature, multiple pulmonary nodules, nodular opacities consistent with metastatic foci are noted throughout the lung and virtually all segments bilaterally.  There is left axillary adenopathy.  There are multiple lytic bone lesions in the thoracic spine largely at T7 on the left anteriorly.  CT of the abdomen and pelvis showed renal atrophy bilaterally, more marked on the right than the left.  There is extensive  hydronephrosis and ureterectasis with more pronounced on the right than on the left.  There is diffuse thickening of the wall of the ureter and collecting system on the right.  Porcelain gallbladder with cholelithiasis.  No bowel obstruction.  No abscess.  No ascites.  No adenopathy in the abdomen or pelvis.  Lytic lesions in the inferior left iliac from bone were noted. Surprisingly, bone scan showed no evidence of metastasis.  There was soft tissue uptake in the region of the left breast corresponding to the patient's known left breast mass, but no evidence of osseous metastatic disease noted by bone scan.  PET scan is still pending, will be arranged as outpatient.  Her other labs; sodium was 137, potassium 5.8, chloride was 115, bicarb was 7, BUN 106, creatinine 4.14.  Her calcium was 8.4. Repeat electrolytes done on 10/01; sodium 138, potassium 3.5, chloride 107, bicarb is 24, BUN 34, creatinine 2.38, and glucose was 139.  Her hemoglobin was 7.7, hematocrit 25, white count of 4.9.  Repeat hemoglobin after post transfusion was 12, hematocrit 35.2.  Repeat hemoglobin was 10.7, hematocrit 32.6 yesterday.  On 10/01; hemoglobin was 11, hematocrit 34.1, white count of 12.6, which has been stable. Her iron was 32, iron binding capacity 100, total iron binding capacity 132, ferritin level was 145.  BRIEF HOSPITAL COURSE:  The patient was admitted to Oncology floor.  The patient was started on IV ciprofloxacin. Pancultures were obtained and also was started on IV fluids.  Hematology/Oncology consultation and Neurology consultations were obtained.  Foley catheter was inserted with improvement in her renal function with slow hydration, which has been discontinued for the last 2 days.  The patient is voiding okay.  The patient was started empirically on Aromasin.  The patient underwent left lymph node biopsy, the results of which are not available.  Discussed with the patient regarding skilled  nursing facility/rehab, but the patient absolutely refused and wanted to go home.  The patient will be discharged home on above medications and will be followed up as outpatient at [facility] and also Urology as scheduled as outpatient and with me in 2 weeks.     Allegra Lai. Terrence Dupont, M.D.     MNH/MEDQ  D:  09/01/2016  T:  09/02/2016  Job:  [id number]

## 2016-09-04 ENCOUNTER — Encounter (HOSPITAL_COMMUNITY): Payer: Self-pay | Admitting: Emergency Medicine

## 2016-09-04 ENCOUNTER — Emergency Department (HOSPITAL_COMMUNITY): Payer: Medicare Other

## 2016-09-04 ENCOUNTER — Emergency Department (HOSPITAL_COMMUNITY)
Admission: EM | Admit: 2016-09-04 | Discharge: 2016-09-04 | Disposition: A | Discharge status: Home / Self Care | Payer: Medicare Other | Attending: Emergency Medicine | Admitting: Emergency Medicine

## 2016-09-04 DIAGNOSIS — R55 Syncope and collapse: Secondary | ICD-10-CM | POA: Insufficient documentation

## 2016-09-04 DIAGNOSIS — E11649 Type 2 diabetes mellitus with hypoglycemia without coma: Secondary | ICD-10-CM | POA: Insufficient documentation

## 2016-09-04 DIAGNOSIS — Z7982 Long term (current) use of aspirin: Secondary | ICD-10-CM | POA: Insufficient documentation

## 2016-09-04 DIAGNOSIS — Z79899 Other long term (current) drug therapy: Secondary | ICD-10-CM | POA: Diagnosis not present

## 2016-09-04 DIAGNOSIS — I10 Essential (primary) hypertension: Secondary | ICD-10-CM | POA: Diagnosis not present

## 2016-09-04 DIAGNOSIS — E162 Hypoglycemia, unspecified: Secondary | ICD-10-CM

## 2016-09-04 DIAGNOSIS — Z853 Personal history of malignant neoplasm of breast: Secondary | ICD-10-CM | POA: Diagnosis not present

## 2016-09-04 LAB — URINE MICROSCOPIC-ADD ON

## 2016-09-04 LAB — CBC WITH DIFFERENTIAL/PLATELET
BASOS ABS: 0 10*3/uL (ref 0.0–0.1)
BASOS PCT: 0 %
EOS ABS: 0 10*3/uL (ref 0.0–0.7)
Eosinophils Relative: 0 %
HCT: 40.4 % (ref 36.0–46.0)
Hemoglobin: 12.5 g/dL (ref 12.0–15.0)
Lymphocytes Relative: 30 %
Lymphs Abs: 2.2 10*3/uL (ref 0.7–4.0)
MCH: 28 pg (ref 26.0–34.0)
MCHC: 30.9 g/dL (ref 30.0–36.0)
MCV: 90.4 fL (ref 78.0–100.0)
MONO ABS: 0.4 10*3/uL (ref 0.1–1.0)
MONOS PCT: 5 %
NEUTROS PCT: 65 %
Neutro Abs: 4.8 10*3/uL (ref 1.7–7.7)
Platelets: 207 10*3/uL (ref 150–400)
RBC: 4.47 MIL/uL (ref 3.87–5.11)
RDW: 16.3 % — AB (ref 11.5–15.5)
WBC: 7.4 10*3/uL (ref 4.0–10.5)

## 2016-09-04 LAB — COMPREHENSIVE METABOLIC PANEL
ALK PHOS: 103 U/L (ref 38–126)
ALT: 10 U/L — ABNORMAL LOW (ref 14–54)
AST: 24 U/L (ref 15–41)
Albumin: 2.2 g/dL — ABNORMAL LOW (ref 3.5–5.0)
Anion gap: 8 (ref 5–15)
BILIRUBIN TOTAL: 0.5 mg/dL (ref 0.3–1.2)
BUN: 20 mg/dL (ref 6–20)
CALCIUM: 6.9 mg/dL — AB (ref 8.9–10.3)
CO2: 25 mmol/L (ref 22–32)
CREATININE: 2.21 mg/dL — AB (ref 0.44–1.00)
Chloride: 109 mmol/L (ref 101–111)
GFR calc non Af Amer: 22 mL/min — ABNORMAL LOW (ref >60)
GFR, EST AFRICAN AMERICAN: 25 mL/min — AB (ref >60)
GLUCOSE: 92 mg/dL (ref 65–99)
Potassium: 3.7 mmol/L (ref 3.5–5.1)
SODIUM: 142 mmol/L (ref 135–145)
Total Protein: 5.9 g/dL — ABNORMAL LOW (ref 6.5–8.1)

## 2016-09-04 LAB — URINALYSIS, ROUTINE W REFLEX MICROSCOPIC
BILIRUBIN URINE: NEGATIVE
Glucose, UA: NEGATIVE mg/dL
KETONES UR: NEGATIVE mg/dL
NITRITE: NEGATIVE
PROTEIN: 100 mg/dL — AB
Specific Gravity, Urine: 1.009 (ref 1.005–1.030)
pH: 7 (ref 5.0–8.0)

## 2016-09-04 LAB — CBG MONITORING, ED
GLUCOSE-CAPILLARY: 87 mg/dL (ref 65–99)
GLUCOSE-CAPILLARY: 84 mg/dL (ref 65–99)
GLUCOSE-CAPILLARY: 131 mg/dL — AB (ref 65–99)
GLUCOSE-CAPILLARY: 162 mg/dL — AB (ref 65–99)
Glucose-Capillary: 140 mg/dL — ABNORMAL HIGH (ref 65–99)
Glucose-Capillary: 73 mg/dL (ref 65–99)
Glucose-Capillary: 116 mg/dL — ABNORMAL HIGH (ref 65–99)
Glucose-Capillary: 117 mg/dL — ABNORMAL HIGH (ref 65–99)

## 2016-09-04 MED ORDER — CEPHALEXIN 500 MG PO CAPS: 500.0000 mg | ORAL_CAPSULE | Freq: Two times a day (BID) | ORAL | 0 refills | Status: DC

## 2016-09-04 MED ORDER — SODIUM CHLORIDE 0.9 % IV BOLUS (SEPSIS)
1000.0000 mL | Freq: Once | INTRAVENOUS | Status: AC
  Administered 2016-09-04: 1000 mL via INTRAVENOUS

## 2016-09-04 MED ORDER — DEXTROSE 5 % IV SOLN
1.0000 g | Freq: Once | INTRAVENOUS | Status: AC
  Administered 2016-09-04: 1 g via INTRAVENOUS
  Filled 2016-09-04: qty 10

## 2016-09-04 NOTE — ED Notes (Signed)
Gave Pt [place] sandwich and can of Coke.

## 2016-09-04 NOTE — Discharge Instructions (Signed)
DO NOT TAKE ANY glimepiride OR OTHER DIABETES MEDICATIONS. FOLLOW UP WITH YOUR PRIMARY CARE DOCTOR ABOUT YOUR DIABETES MANAGEMENT.

## 2016-09-04 NOTE — ED Triage Notes (Signed)
Per EMS: pt was eating breakfast this morning and took her Mx "glycperide".  Pt passed out and EMS was called. On scene the EMS got CBG of 42 and a BP of 87/50. EMS gave 25 g of D10, 350 cc of NS and suctioned secretions from Pt's mouth.. Next CGB was 178. Upon admission her CBG was 140. Pt has a 22 in the right wrist.  Vitals from EMS were 111/73, HR 74, Spo2 100, RR 16,

## 2016-09-04 NOTE — ED Notes (Signed)
Bed: WA07 Expected date:  Expected time:  Means of arrival:  Comments: EMS-hypoglycemia

## 2016-09-04 NOTE — ED Notes (Signed)
Patient d/c'd self care.  F/U and medications reviewed.  Patient verbalized understanding. 

## 2016-09-04 NOTE — ED Notes (Signed)
Gave Pt 4 oz of OJ

## 2016-09-04 NOTE — ED Provider Notes (Signed)
[facility] DEPT Provider Note   CSN: [id number] Arrival date & time: 09/04/16  1147     History   Chief Complaint Chief Complaint  Patient presents with  . Hypoglycemia    HPI Becky Pena is a 70 y.o. female.  70yo F w/ PMH including recently diagnosed metastatic breast cancer, type 2 diabetes mellitus, hypertension who presents with hypoglycemia. The patient states that this morning she breakfast and checked her blood sugar which was in the 140s which she felt was high. She took a dose of glimepiride and later passed out and EMS was called. They report that her blood glucose was 42 and BP 87/50. They gave her dextrose and a small fluid bolus and suctioned secretions from her mouth. Repeat blood glucose was 178. She states that she cannot recall the details of the event. She states she does not think she fell and she denies any pain anywhere. No cough/cold symptoms, fever, chest pain, shortness of breath, abdominal pain, or vomiting. No urinary symptoms. She states she has had some loose stools. She was just discharged on 10/3 after admission for AKI. During that admission she had a biopsy and had evidence of metastatic breast cancer on CT scan. She was discharged w/ diet control only for T2DM but she states she took an old prescription for glimepiride because of her higher blood sugar this morning.   The history is provided by the patient.  Hyperglycemia    Past Medical History:  Diagnosis Date  . Breast cancer metastasized to multiple sites, left ([place]) 09/02/2016  . Diabetes mellitus without complication [facility])     Patient Active Problem List   Diagnosis Date Noted  . Breast cancer metastasized to multiple sites, left ([place]) 09/02/2016  . Acute renal failure (ARF) ([place]) 08/23/2016    Past Surgical History:  Procedure Laterality Date  . EYE SURGERY     LEFT EYE     OB History    No data available       Home Medications    Prior to Admission medications     Medication Sig Start Date End Date Taking? Authorizing Provider  aspirin 81 MG chewable tablet Chew by mouth daily.   Yes Historical Provider, MD  folic acid (FOLVITE) 1 MG tablet Take 1 tablet (1 mg total) by mouth daily. 09/01/16  Yes Charolette Forward, MD  mirtazapine (REMERON SOL-TAB) 15 MG disintegrating tablet Take 1 tablet (15 mg total) by mouth at bedtime. 09/01/16  Yes Charolette Forward, MD  sodium bicarbonate 650 MG tablet Take 1 tablet (650 mg total) by mouth 2 (two) times daily. 09/01/16  Yes Charolette Forward, MD  cephALEXin (KEFLEX) 500 MG capsule Take 1 capsule (500 mg total) by mouth 2 (two) times daily. 09/04/16   Sharlett Iles, MD  exemestane (AROMASIN) 25 MG tablet Take 1 tablet (25 mg total) by mouth daily after breakfast. Patient not taking: Reported on 09/04/2016 09/01/16   Charolette Forward, MD  feeding supplement, ENSURE ENLIVE, (ENSURE ENLIVE) LIQD Take 237 mLs by mouth 2 (two) times daily between meals. Patient not taking: Reported on 09/04/2016 09/01/16   Charolette Forward, MD  Multiple Vitamin (MULTIVITAMIN WITH MINERALS) TABS tablet Take 1 tablet by mouth daily. Patient not taking: Reported on 09/04/2016 09/01/16   Charolette Forward, MD    Family History No family history on file.  Social History Social History  Substance Use Topics  . Smoking status: Never Smoker  . Smokeless tobacco: Never Used  . Alcohol use No  Allergies   Review of patient's allergies indicates no known allergies.   Review of Systems Review of Systems 10 Systems reviewed and are negative for acute change except as noted in the HPI.   Physical Exam Updated Vital Signs BP 104/70   Pulse 86   Temp 97.9 F (36.6 C) (Oral)   Resp 19   Ht 5\' 3"  (1.6 m)   Wt 140 lb (63.5 kg)   SpO2 100%   BMI 24.80 kg/m   Physical Exam  Constitutional: She is oriented to person, place, and time. She appears well-developed and well-nourished. No distress.  HENT:  Head: Normocephalic and atraumatic.  Moist  mucous membranes  Eyes: Conjunctivae are normal.  R pupil round and reactive to light, blind in L eye  Neck: Neck supple.  No midline c-spine tenderness  Cardiovascular: Normal rate, regular rhythm and normal heart sounds.   No murmur heard. Pulmonary/Chest: Effort normal and breath sounds normal.  Firm, non-tender L lateral breast mass  Abdominal: Soft. Bowel sounds are normal. She exhibits no distension. There is no tenderness.  Musculoskeletal: She exhibits no edema or tenderness.  Neurological: She is alert and oriented to person, place, and time.  Fluent speech  Skin: Skin is warm and dry.  excoriations on L anterior chest  Psychiatric:  Very anxious, occasionally tearful  Nursing note and vitals reviewed.    ED Treatments / Results  Labs (all labs ordered are listed, but only abnormal results are displayed) Labs Reviewed  COMPREHENSIVE METABOLIC PANEL - Abnormal; Notable for the following:       Result Value   Creatinine, Ser 2.21 (*)    Calcium 6.9 (*)    Total Protein 5.9 (*)    Albumin 2.2 (*)    ALT 10 (*)    GFR calc non Af Amer 22 (*)    GFR calc Af Amer 25 (*)    All other components within normal limits  CBC WITH DIFFERENTIAL/PLATELET - Abnormal; Notable for the following:    RDW 16.3 (*)    All other components within normal limits  URINALYSIS, ROUTINE W REFLEX MICROSCOPIC (NOT AT [facility]) - Abnormal; Notable for the following:    APPearance TURBID (*)    Hgb urine dipstick LARGE (*)    Protein, ur 100 (*)    Leukocytes, UA LARGE (*)    All other components within normal limits  URINE MICROSCOPIC-ADD ON - Abnormal; Notable for the following:    Squamous Epithelial / LPF TOO NUMEROUS TO COUNT (*)    Bacteria, UA MANY (*)    All other components within normal limits  CBG MONITORING, ED - Abnormal; Notable for the following:    Glucose-Capillary 140 (*)    All other components within normal limits  CBG MONITORING, ED - Abnormal; Notable for the following:     Glucose-Capillary 116 (*)    All other components within normal limits  CBG MONITORING, ED - Abnormal; Notable for the following:    Glucose-Capillary 131 (*)    All other components within normal limits  CBG MONITORING, ED - Abnormal; Notable for the following:    Glucose-Capillary 117 (*)    All other components within normal limits  CBG MONITORING, ED - Abnormal; Notable for the following:    Glucose-Capillary 162 (*)    All other components within normal limits  URINE CULTURE  CBG MONITORING, ED  CBG MONITORING, ED  CBG MONITORING, ED    EKG  EKG Interpretation None  Radiology Ct Head Wo Contrast  Result Date: 09/04/2016 CLINICAL DATA:  Loss of consciousness today. Hyperglycemia with loss of consciousness. Metastatic breast cancer. EXAM: CT HEAD WITHOUT CONTRAST TECHNIQUE: Contiguous axial images were obtained from the base of the skull through the vertex without intravenous contrast. COMPARISON:  MRI brain 08/25/2016. FINDINGS: Brain: There is no evidence of acute intracranial hemorrhage, mass lesion, brain edema or extra-axial fluid collection. The ventricles and subarachnoid spaces are appropriately sized for age. There is no CT evidence of acute cortical infarction. Mild periventricular white matter disease, likely due to chronic small vessel ischemic changes. Vascular: Intracranial vascular calcifications are present. Skull: Negative for fracture or focal lesion. Sinuses/Orbits: The visualized paranasal sinuses, mastoid air cells and middle ears are clear. Shrunken left globe with calcifications again noted. Other: None. IMPRESSION: 1. No acute intracranial findings. No evidence of metastatic disease. 2. Mild chronic periventricular white matter disease. 3. 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 Electronically Signed   By: Richardean Sale M.D.   On: 09/04/2016 15:39    Procedures Procedures (including critical care time)  Medications Ordered in ED Medications  sodium chloride  0.9 % bolus 1,000 mL (0 mLs Intravenous Stopped 09/04/16 1726)  cefTRIAXone (ROCEPHIN) 1 g in dextrose 5 % 50 mL IVPB (0 g Intravenous Stopped 09/04/16 1830)     Initial Impression / Assessment and Plan / ED Course  I have reviewed the triage vital signs and the nursing notes.  Pertinent labs & imaging results that were available during my care of the patient were reviewed by me and considered in my medical decision making (see chart for details).  Clinical Course   PT w/ episode of LOC and hypoglycemia after taking A dose of glimepiride this morning, according to recent discharge summary she has not supposed to be on any diabetes medications. She was awake and alert, no evidence of trauma and no complaints on exam. Gave patient fluid bolus and food and obtained above labs.  Labs showed evidence of UTI, urine culture sent. Creatinine 2.21 which is improving from her recent hospitalization, reassuring CBC. The patient was observed for a total of 8 hours during her workup and treatment, during which time her blood glucose remained stable and she never had any further episodes of hypoglycemia. She is well-appearing on reexamination. I have emphasized the importance of discontinuing all old prescriptions for diabetes medications until she follows up with primary care provider to discuss further. She voiced understanding of this plan as well as return precautions and was discharged in satisfactory condition.  Final Clinical Impressions(s) / ED Diagnoses   Final diagnoses:  Hypoglycemia    New Prescriptions Discharge Medication List as of 09/04/2016  8:42 PM    START taking these medications   Details  cephALEXin (KEFLEX) 500 MG capsule Take 1 capsule (500 mg total) by mouth 2 (two) times daily., Starting Fri 09/04/2016, Print         Sharlett Iles, MD 09/05/16 [phone]

## 2016-09-04 NOTE — ED Notes (Signed)
[facility] called for transport.  

## 2016-09-04 NOTE — ED Notes (Signed)
MD at bedside. EDP LITTLE RE EVALUATING PT. DINNER DIET ORDERED FOR THIS PT

## 2016-09-05 LAB — URINE CULTURE
Culture: NO GROWTH
Special Requests: NORMAL

## 2016-09-16 ENCOUNTER — Ambulatory Visit (HOSPITAL_BASED_OUTPATIENT_CLINIC_OR_DEPARTMENT_OTHER): Payer: Medicare Other | Admitting: Hematology & Oncology

## 2016-09-16 ENCOUNTER — Other Ambulatory Visit (HOSPITAL_BASED_OUTPATIENT_CLINIC_OR_DEPARTMENT_OTHER): Payer: Medicare Other

## 2016-09-16 ENCOUNTER — Encounter: Payer: Self-pay | Admitting: Hematology & Oncology

## 2016-09-16 ENCOUNTER — Ambulatory Visit (HOSPITAL_BASED_OUTPATIENT_CLINIC_OR_DEPARTMENT_OTHER): Payer: Medicare Other

## 2016-09-16 DIAGNOSIS — C7951 Secondary malignant neoplasm of bone: Secondary | ICD-10-CM

## 2016-09-16 DIAGNOSIS — C50912 Malignant neoplasm of unspecified site of left female breast: Secondary | ICD-10-CM

## 2016-09-16 DIAGNOSIS — Z5111 Encounter for antineoplastic chemotherapy: Secondary | ICD-10-CM

## 2016-09-16 DIAGNOSIS — R911 Solitary pulmonary nodule: Secondary | ICD-10-CM | POA: Diagnosis not present

## 2016-09-16 LAB — CMP (CANCER CENTER ONLY)
ALBUMIN: 2.3 g/dL — AB (ref 3.3–5.5)
ALT(SGPT): 17 U/L (ref 10–47)
AST: 22 U/L (ref 11–38)
Alkaline Phosphatase: 134 U/L — ABNORMAL HIGH (ref 26–84)
BUN, Bld: 23 mg/dL — ABNORMAL HIGH (ref 7–22)
CALCIUM: 7.8 mg/dL — AB (ref 8.0–10.3)
CHLORIDE: 106 meq/L (ref 98–108)
CO2: 27 meq/L (ref 18–33)
CREATININE: 2.3 mg/dL — AB (ref 0.6–1.2)
Glucose, Bld: 199 mg/dL — ABNORMAL HIGH (ref 73–118)
Potassium: 4.4 mEq/L (ref 3.3–4.7)
SODIUM: 143 meq/L (ref 128–145)
Total Bilirubin: 0.5 mg/dl (ref 0.20–1.60)
Total Protein: 5.9 g/dL — ABNORMAL LOW (ref 6.4–8.1)

## 2016-09-16 LAB — CBC WITH DIFFERENTIAL (CANCER CENTER ONLY)
BASO#: 0 10*3/uL (ref 0.0–0.2)
BASO%: 0.6 % (ref 0.0–2.0)
EOS%: 2.4 % (ref 0.0–7.0)
Eosinophils Absolute: 0.2 10*3/uL (ref 0.0–0.5)
HCT: 35.6 % (ref 34.8–46.6)
HEMOGLOBIN: 11 g/dL — AB (ref 11.6–15.9)
LYMPH#: 2.1 10*3/uL (ref 0.9–3.3)
LYMPH%: 31.1 % (ref 14.0–48.0)
MCH: 28.3 pg (ref 26.0–34.0)
MCHC: 30.9 g/dL — AB (ref 32.0–36.0)
MCV: 92 fL (ref 81–101)
MONO#: 0.4 10*3/uL (ref 0.1–0.9)
MONO%: 6.1 % (ref 0.0–13.0)
NEUT%: 59.8 % (ref 39.6–80.0)
NEUTROS ABS: 4 10*3/uL (ref 1.5–6.5)
PLATELETS: 265 10*3/uL (ref 145–400)
RBC: 3.89 10*6/uL (ref 3.70–5.32)
RDW: 16.1 % — AB (ref 11.1–15.7)
WBC: 6.6 10*3/uL (ref 3.9–10.0)

## 2016-09-16 MED ORDER — FULVESTRANT 250 MG/5ML IM SOLN
500.0000 mg | Freq: Once | INTRAMUSCULAR | Status: AC
  Administered 2016-09-16: 500 mg via INTRAMUSCULAR

## 2016-09-16 MED ORDER — FULVESTRANT 250 MG/5ML IM SOLN
INTRAMUSCULAR | Status: AC
  Filled 2016-09-16: qty 10

## 2016-09-16 NOTE — Progress Notes (Signed)
Hematology and Oncology Follow Up Visit  Becky Pena [id number] 1946/11/06 70 y.o. 09/16/2016   Principle Diagnosis:   Metastatic breast cancer- ER -/Her2 -  Current Therapy:    Observation     Interim History:  Becky Pena is back for Becky first office visit. I saw Becky in the hospital in consultation. I probably saw Becky about 3 or 4 weeks ago. She basically presented with progressive fatigue and weakness. She had a large left breast mass. She had multiple pulmonary nodules. She had left axillary adenopathy. She had metastatic disease in Becky vertebral bodies. Largest was that T7.   She had a bone scan done. Surprisingly enough, the bone scan did not show any obvious metastatic disease to the spine.  Becky initial CA 27.29 was 346.  We did a biopsy of a left axillar lymph node. The pathology report([id number]) showed metastatic adenocarcinoma with abundant extracellular mucin. Of course, the breast prognostic factors are not yet back. On the path report, it said that the tumor was negative for estrogen receptor.  She is at home. She comes in a ambulance. Really didn't and not sure how she is managing at home. She lives with Becky Pena.  She actually looks a little bit better than when she was in the hospital.  She says Becky appetite is doing better. She's had no nausea or vomiting. She's had no change in bowel or bladder habits. She's not hurting.  Overall, Becky performance status is ECOG 3.  Medications:  Current Outpatient Prescriptions:  .  aspirin 81 MG chewable tablet, Chew by mouth daily., Disp: , Rfl:  .  cephALEXin (KEFLEX) 500 MG capsule, Take 1 capsule (500 mg total) by mouth 2 (two) times daily., Disp: 14 capsule, Rfl: 0 .  feeding supplement, ENSURE ENLIVE, (ENSURE ENLIVE) LIQD, Take 237 mLs by mouth 2 (two) times daily between meals., Disp: 237 mL, Rfl: 12 .  folic acid (FOLVITE) 1 MG tablet, Take 1 tablet (1 mg total) by mouth daily., Disp: 30 tablet, Rfl: 3 .   mirtazapine (REMERON SOL-TAB) 15 MG disintegrating tablet, Take 1 tablet (15 mg total) by mouth at bedtime., Disp: 30 tablet, Rfl: 3 .  sodium bicarbonate 650 MG tablet, Take 1 tablet (650 mg total) by mouth 2 (two) times daily., Disp: 60 tablet, Rfl: 3 .  exemestane (AROMASIN) 25 MG tablet, Take 1 tablet (25 mg total) by mouth daily after breakfast. (Patient not taking: Reported on 09/16/2016), Disp: 30 tablet, Rfl: 3 .  Multiple Vitamin (MULTIVITAMIN WITH MINERALS) TABS tablet, Take 1 tablet by mouth daily. (Patient not taking: Reported on 09/16/2016), Disp: 30 tablet, Rfl: 3  Allergies: No Known Allergies  Past Medical History, Surgical history, Social history, and Family History were reviewed and updated.  Review of Systems: As above   Physical Exam:  height is '5\' 3"'$  (1.6 m) and weight is 140 lb (63.5 kg). Becky oral temperature is 98.7 F (37.1 C). Becky blood pressure is 149/65 (abnormal) and Becky pulse is 81. Becky respiration is 18.   Wt Readings from Last 3 Encounters:  09/16/16 140 lb (63.5 kg)  09/04/16 140 lb (63.5 kg)  09/01/16 134 lb 7.7 oz (61 kg)     Chronically ill appearing, petite, African-American female in no obvious distress. Head and neck exam shows no ocular or oral lesions. She has ptosis of the left eye. I think she may have a traumatic cataract of the left eye. She has had no adenopathy in the neck. Lungs are relatively  clear bilaterally. Cardiac exam regular rate and rhythm with a normal S1 and S2. There are no murmurs, rubs or bruits. Abdomen is soft. She has good bowel sounds. There is no fluid wave. There is no palpable liver or spleen tip. Breast exam shows large mass in the outer portion of the left breast. I cannot palpate any obvious left axillar adenopathy. Right breast is unremarkable. Extremities shows no clubbing, cyanosis or edema. Neurological exam shows no focal neurological deficits. Skin exam shows no rashes, ecchymosis or petechia.   Lab Results    Component Value Date   WBC 6.6 09/16/2016   HGB 11.0 (L) 09/16/2016   HCT 35.6 09/16/2016   MCV 92 09/16/2016   PLT 265 09/16/2016     Chemistry      Component Value Date/Time   NA 143 09/16/2016 1335   K 4.4 09/16/2016 1335   CL 106 09/16/2016 1335   CO2 27 09/16/2016 1335   BUN 23 (H) 09/16/2016 1335   CREATININE 2.3 (H) 09/16/2016 1335      Component Value Date/Time   CALCIUM 7.8 (L) 09/16/2016 1335   ALKPHOS 134 (H) 09/16/2016 1335   AST 22 09/16/2016 1335   ALT 17 09/16/2016 1335   BILITOT 0.50 09/16/2016 1335         Impression and Plan: Becky Pena is A 70 year old postmenopausal Afro-American female. She has a large left breast mass. She had a left axillary lymph node biopsied. I'm absolutely shocked that this is estrogen negative. I will have to call the pathology and see why the prognostic markers are not done. I would have to imagine had this is breast cancer. I cannot imagine that this be any other kind of malignancy.  She had been on Aromasin in the hospital. She did not get this as an outpatient.  Again will have to see what the actual estrogen receptor and Becky-2 receptor is. These will dictate what needs to be done.  If she is truly estrogen negative, I am just not sure what kind of candidate she is for chemotherapy. She has a poor performance status. She's not really able to do much at home.  Again I am totally shocked at this is estrogen negative.  She really is not able to do other tests right now. She was brought in him balance. I'm still not sure how she is able to survive at home area did  We will plan to get Becky back in another month. We probably will get Becky back sooner depending on the results of the receptor studies.  I spent about 1 hour with Becky today. We had a good prayer session. I asked Becky if I could prate for Becky and she jumped out of the chance.     Volanda Napoleon, MD 10/18/20175:58 PM

## 2016-09-16 NOTE — Patient Instructions (Signed)

## 2016-09-17 LAB — IRON AND TIBC
%SAT: 32 % (ref 21–57)
IRON: 54 ug/dL (ref 41–142)
TIBC: 166 ug/dL — AB (ref 236–444)
UIBC: 112 ug/dL — AB (ref 120–384)

## 2016-09-17 LAB — CANCER ANTIGEN 27.29: CA 27.29: 448.8 U/mL — ABNORMAL HIGH (ref 0.0–38.6)

## 2016-09-17 LAB — FERRITIN: FERRITIN: 138 ng/mL (ref 9–269)

## 2016-10-23 ENCOUNTER — Other Ambulatory Visit (HOSPITAL_BASED_OUTPATIENT_CLINIC_OR_DEPARTMENT_OTHER): Payer: Medicare Other

## 2016-10-23 ENCOUNTER — Ambulatory Visit (HOSPITAL_BASED_OUTPATIENT_CLINIC_OR_DEPARTMENT_OTHER): Payer: Medicare Other | Admitting: Hematology & Oncology

## 2016-10-23 VITALS — BP 126/67 | HR 69 | Temp 98.3°F | Resp 16

## 2016-10-23 DIAGNOSIS — C773 Secondary and unspecified malignant neoplasm of axilla and upper limb lymph nodes: Secondary | ICD-10-CM | POA: Diagnosis not present

## 2016-10-23 DIAGNOSIS — N6320 Unspecified lump in the left breast, unspecified quadrant: Secondary | ICD-10-CM | POA: Diagnosis present

## 2016-10-23 DIAGNOSIS — C50912 Malignant neoplasm of unspecified site of left female breast: Secondary | ICD-10-CM

## 2016-10-23 DIAGNOSIS — E139 Other specified diabetes mellitus without complications: Secondary | ICD-10-CM

## 2016-10-23 DIAGNOSIS — N1330 Unspecified hydronephrosis: Secondary | ICD-10-CM

## 2016-10-23 LAB — CBC WITH DIFFERENTIAL (CANCER CENTER ONLY)
BASO#: 0.1 10*3/uL (ref 0.0–0.2)
BASO%: 0.7 % (ref 0.0–2.0)
EOS ABS: 0.2 10*3/uL (ref 0.0–0.5)
EOS%: 2.7 % (ref 0.0–7.0)
HCT: 39.9 % (ref 34.8–46.6)
HGB: 13 g/dL (ref 11.6–15.9)
LYMPH#: 1.8 10*3/uL (ref 0.9–3.3)
LYMPH%: 25.6 % (ref 14.0–48.0)
MCH: 28.2 pg (ref 26.0–34.0)
MCHC: 32.6 g/dL (ref 32.0–36.0)
MCV: 87 fL (ref 81–101)
MONO#: 0.3 10*3/uL (ref 0.1–0.9)
MONO%: 4.6 % (ref 0.0–13.0)
NEUT#: 4.6 10*3/uL (ref 1.5–6.5)
NEUT%: 66.4 % (ref 39.6–80.0)
PLATELETS: 219 10*3/uL (ref 145–400)
RBC: 4.61 10*6/uL (ref 3.70–5.32)
RDW: 14.4 % (ref 11.1–15.7)
WBC: 7 10*3/uL (ref 3.9–10.0)

## 2016-10-23 LAB — COMPREHENSIVE METABOLIC PANEL
ALK PHOS: 218 U/L — AB (ref 40–150)
ALT: <6 U/L (ref 0–55)
ANION GAP: 10 meq/L (ref 3–11)
AST: 11 U/L (ref 5–34)
Albumin: 3.3 g/dL — ABNORMAL LOW (ref 3.5–5.0)
BILIRUBIN TOTAL: 0.31 mg/dL (ref 0.20–1.20)
BUN: 64.3 mg/dL — ABNORMAL HIGH (ref 7.0–26.0)
CO2: 14 meq/L — AB (ref 22–29)
Calcium: 10.2 mg/dL (ref 8.4–10.4)
Chloride: 111 mEq/L — ABNORMAL HIGH (ref 98–109)
Creatinine: 2.4 mg/dL — ABNORMAL HIGH (ref 0.6–1.1)
EGFR: 23 mL/min/{1.73_m2} — AB (ref >90)
Glucose: 399 mg/dl — ABNORMAL HIGH (ref 70–140)
POTASSIUM: 5.3 meq/L — AB (ref 3.5–5.1)
Sodium: 136 mEq/L (ref 136–145)
TOTAL PROTEIN: 7.8 g/dL (ref 6.4–8.3)

## 2016-10-23 MED ORDER — GLIMEPIRIDE 1 MG PO TABS: 1.0000 mg | ORAL_TABLET | Freq: Every day | ORAL | 2 refills | Status: DC

## 2016-10-23 NOTE — Progress Notes (Signed)
Hematology and Oncology Follow Up Visit  Becky Pena [id number] November 28, 1946 70 y.o. 10/23/2016   Principle Diagnosis:   Metastatic breast cancer- ER -/Her2  (?)  Current Therapy:    Observation     Interim History:  Becky Pena is back for Becky first office visit. She still is living at home. She is with Becky Pena.  She comes by ambulance. Oxygen right sure how much she really is able to do at home.  She is not complaining of pain. She says she is eating okay.  She has had no fever. She's had no bleeding.  We are trying to get the Becky-2 status. Unfortunately, which surprises me, there was not enough biopsy material for the Becky-2 to be run. We will try to run it off blood work. We will send the samples to [facility].  She has not noted any swelling of the left arm. She is not complaining of any left chest wall pain.  There's been no obvious change in bowel or bladder habits. She has been seen by urology in the past because of hydronephrosis.  Becky last CA 27.29 was 450.  Overall, Becky performance status is ECOG 3. Medications:  Current Outpatient Prescriptions:  .  aspirin 81 MG chewable tablet, Chew by mouth daily., Disp: , Rfl:  .  cephALEXin (KEFLEX) 500 MG capsule, Take 1 capsule (500 mg total) by mouth 2 (two) times daily., Disp: 14 capsule, Rfl: 0 .  exemestane (AROMASIN) 25 MG tablet, Take 1 tablet (25 mg total) by mouth daily after breakfast., Disp: 30 tablet, Rfl: 3 .  feeding supplement, ENSURE ENLIVE, (ENSURE ENLIVE) LIQD, Take 237 mLs by mouth 2 (two) times daily between meals., Disp: 237 mL, Rfl: 12 .  folic acid (FOLVITE) 1 MG tablet, Take 1 tablet (1 mg total) by mouth daily., Disp: 30 tablet, Rfl: 3 .  mirtazapine (REMERON SOL-TAB) 15 MG disintegrating tablet, Take 1 tablet (15 mg total) by mouth at bedtime., Disp: 30 tablet, Rfl: 3 .  Multiple Vitamin (MULTIVITAMIN WITH MINERALS) TABS tablet, Take 1 tablet by mouth daily., Disp: 30 tablet, Rfl: 3 .  sodium  bicarbonate 650 MG tablet, Take 1 tablet (650 mg total) by mouth 2 (two) times daily., Disp: 60 tablet, Rfl: 3 .  glimepiride (AMARYL) 1 MG tablet, Take 1 tablet (1 mg total) by mouth daily with breakfast., Disp: 30 tablet, Rfl: 2  Allergies: No Known Allergies  Past Medical History, Surgical history, Social history, and Family History were reviewed and updated.  Review of Systems: As above   Physical Exam:  oral temperature is 98.3 F (36.8 C). Becky blood pressure is 126/67 and Becky pulse is 69. Becky respiration is 16.   Wt Readings from Last 3 Encounters:  09/16/16 140 lb (63.5 kg)  09/04/16 140 lb (63.5 kg)  09/01/16 134 lb 7.7 oz (61 kg)     Chronically ill appearing, petite, African-American female in no obvious distress. Head and neck exam shows no ocular or oral lesions. She has ptosis of the left eye. I think she may have a traumatic cataract of the left eye. She has had no adenopathy in the neck. Lungs are relatively clear bilaterally. Cardiac exam regular rate and rhythm with a normal S1 and S2. There are no murmurs, rubs or bruits. Abdomen is soft. She has good bowel sounds. There is no fluid wave. There is no palpable liver or spleen tip. Breast exam shows large mass in the outer portion of the left breast. I cannot  palpate any obvious left axillary adenopathy. Right breast is unremarkable. Extremities shows no clubbing, cyanosis or edema. Neurological exam shows no focal neurological deficits. Skin exam shows no rashes, ecchymosis or petechia.   Lab Results  Component Value Date   WBC 7.0 10/23/2016   HGB 13.0 10/23/2016   HCT 39.9 10/23/2016   MCV 87 10/23/2016   PLT 219 10/23/2016     Chemistry      Component Value Date/Time   NA 136 10/23/2016 1129   K 5.3 (H) 10/23/2016 1129   CL 106 09/16/2016 1335   CO2 14 (L) 10/23/2016 1129   BUN 64.3 (H) 10/23/2016 1129   CREATININE 2.4 (H) 10/23/2016 1129      Component Value Date/Time   CALCIUM 10.2 10/23/2016 1129    ALKPHOS 218 (H) 10/23/2016 1129   AST 11 10/23/2016 1129   ALT <6 10/23/2016 1129   BILITOT 0.31 10/23/2016 1129         Impression and Plan: Becky Pena is A 70 year old postmenopausal Afro-American female. She has a large left breast mass. She had a left axillary lymph node biopsied. She cannot have the breast mass itself biopsied because radiology would not do it at the hospital and says she would have to go to the breast center.  I really think that she has to be admitted. I am doing fairly concerned about the hyperglycemia. Becky blood sugar was 400. Becky BUN was 64 and creatinine 2.4. Becky potassium was 5.3.  I tried my best to get Becky to understand the significance of these abnormal labs. I really don't think Becky 50 Becky old Pena is able to help Becky. She really does not see another doctor. She does not take anything for Becky blood sugars.   I know that she has hydronephrosis. She has seen urology.   I worry about the acidosis. I worry about Becky hyperkalemia. He was Becky potassium is not that high, with Becky renal function being low, this might be more significant.  I talked Becky about going to the emergency room to at least be evaluated by down there. She refuses to go down there. She was afraid that she would be admitted to the hospital.  I kept trying to convince Becky that something had to be done or she might have an event that could cause Becky to pass on. She knew this was not going to happen because of Becky faith in God.   She did not want any insulin in our office. I offered to do a EKG on Becky. She did not want to have that done.   I was able to give Becky a prescription for Amaryl. I looked back in the old records, Becky previous doctor had Becky on Amaryl. She was on 2 mg a day. I gave Becky a prescription for 1 mg a day given Becky renal function.   I really didn't my best and I did as much as I could possibly do to convince Becky that she needed to have Becky lab work addressed by a Becky Pena  experienced with electrolyte abnormalities. She just refused to have this done.  It would not surprise me if she ended up in the hospital or something worse. Again, I showed Becky the labs. I explained to Becky the consequences of not having these labs addressed. She just did not want to be admitted to the hospital. She did not want to go to the emergency room.   As far as Becky breast cancer  is concerned, hopefully, she is Becky-2 positive. Hopefully the liquid biopsies that we got will show [place] this.  I would like to see Becky back in 3 weeks. I want to get a set of CT scans the same-day that I see Becky.  I spent literally an hour and half with Becky. I just hope that something bad is not happened to Becky at home. As always, she asked me to pray with Becky. I did pray with Becky.   Volanda Napoleon, MD 11/24/20173:19 PM

## 2016-10-24 LAB — CANCER ANTIGEN 27.29: CAN 27.29: 683.9 U/mL — AB (ref 0.0–38.6)

## 2016-11-02 ENCOUNTER — Encounter: Payer: Self-pay | Admitting: Hematology & Oncology

## 2016-11-17 ENCOUNTER — Encounter: Payer: Self-pay | Admitting: Hematology & Oncology

## 2016-11-20 ENCOUNTER — Other Ambulatory Visit (HOSPITAL_BASED_OUTPATIENT_CLINIC_OR_DEPARTMENT_OTHER): Payer: Medicare Other

## 2016-11-20 ENCOUNTER — Ambulatory Visit (HOSPITAL_BASED_OUTPATIENT_CLINIC_OR_DEPARTMENT_OTHER)
Admission: RE | Admit: 2016-11-20 | Discharge: 2016-11-20 | Disposition: A | Discharge status: Home / Self Care | Payer: Medicare Other | Source: Ambulatory Visit | Attending: Hematology & Oncology | Admitting: Hematology & Oncology

## 2016-11-20 ENCOUNTER — Ambulatory Visit (HOSPITAL_BASED_OUTPATIENT_CLINIC_OR_DEPARTMENT_OTHER): Payer: Medicare Other | Admitting: Hematology & Oncology

## 2016-11-20 VITALS — BP 81/49 | HR 66 | Temp 97.8°F | Resp 16

## 2016-11-20 DIAGNOSIS — C50912 Malignant neoplasm of unspecified site of left female breast: Secondary | ICD-10-CM

## 2016-11-20 DIAGNOSIS — K8020 Calculus of gallbladder without cholecystitis without obstruction: Secondary | ICD-10-CM | POA: Insufficient documentation

## 2016-11-20 DIAGNOSIS — E139 Other specified diabetes mellitus without complications: Secondary | ICD-10-CM

## 2016-11-20 DIAGNOSIS — C7800 Secondary malignant neoplasm of unspecified lung: Secondary | ICD-10-CM | POA: Insufficient documentation

## 2016-11-20 DIAGNOSIS — N1330 Unspecified hydronephrosis: Secondary | ICD-10-CM | POA: Insufficient documentation

## 2016-11-20 DIAGNOSIS — C773 Secondary and unspecified malignant neoplasm of axilla and upper limb lymph nodes: Secondary | ICD-10-CM | POA: Insufficient documentation

## 2016-11-20 DIAGNOSIS — M4854XA Collapsed vertebra, not elsewhere classified, thoracic region, initial encounter for fracture: Secondary | ICD-10-CM | POA: Diagnosis not present

## 2016-11-20 DIAGNOSIS — C7951 Secondary malignant neoplasm of bone: Secondary | ICD-10-CM

## 2016-11-20 DIAGNOSIS — E109 Type 1 diabetes mellitus without complications: Secondary | ICD-10-CM | POA: Insufficient documentation

## 2016-11-20 LAB — CBC WITH DIFFERENTIAL (CANCER CENTER ONLY)
BASO#: 0.1 10*3/uL (ref 0.0–0.2)
BASO%: 0.9 % (ref 0.0–2.0)
EOS ABS: 0.1 10*3/uL (ref 0.0–0.5)
EOS%: 1.7 % (ref 0.0–7.0)
HEMATOCRIT: 41.2 % (ref 34.8–46.6)
HEMOGLOBIN: 13.1 g/dL (ref 11.6–15.9)
LYMPH#: 2.4 10*3/uL (ref 0.9–3.3)
LYMPH%: 41.3 % (ref 14.0–48.0)
MCH: 27.8 pg (ref 26.0–34.0)
MCHC: 31.8 g/dL — ABNORMAL LOW (ref 32.0–36.0)
MCV: 87 fL (ref 81–101)
MONO#: 0.4 10*3/uL (ref 0.1–0.9)
MONO%: 6.3 % (ref 0.0–13.0)
NEUT#: 2.9 10*3/uL (ref 1.5–6.5)
NEUT%: 49.8 % (ref 39.6–80.0)
Platelets: 212 10*3/uL (ref 145–400)
RBC: 4.72 10*6/uL (ref 3.70–5.32)
RDW: 13.7 % (ref 11.1–15.7)
WBC: 5.9 10*3/uL (ref 3.9–10.0)

## 2016-11-20 LAB — CMP (CANCER CENTER ONLY)
ALBUMIN: 3.6 g/dL (ref 3.3–5.5)
ALT(SGPT): 9 U/L — ABNORMAL LOW (ref 10–47)
AST: 21 U/L (ref 11–38)
Alkaline Phosphatase: 135 U/L — ABNORMAL HIGH (ref 26–84)
BUN, Bld: 56 mg/dL — ABNORMAL HIGH (ref 7–22)
CALCIUM: 9.7 mg/dL (ref 8.0–10.3)
CHLORIDE: 113 meq/L — AB (ref 98–108)
CO2: 20 meq/L (ref 18–33)
Creat: 2 mg/dl — ABNORMAL HIGH (ref 0.6–1.2)
Glucose, Bld: 110 mg/dL (ref 73–118)
SODIUM: 140 meq/L (ref 128–145)
TOTAL PROTEIN: 6.9 g/dL (ref 6.4–8.1)
Total Bilirubin: 0.5 mg/dl (ref 0.20–1.60)

## 2016-11-21 LAB — CANCER ANTIGEN 27.29: CAN 27.29: 539.9 U/mL — AB (ref 0.0–38.6)

## 2016-11-24 NOTE — Progress Notes (Signed)
Hematology and Oncology Follow Up Visit  Becky Pena [id number] Jan 10, 1946 70 y.o. 11/24/2016   Principle Diagnosis:   Metastatic breast cancer- ER -/Her2  (?)  Current Therapy:    Observation     Interim History:  Becky Pena is back for follow-up. She is about the same. She is still living at home. She comes in a wheelchair.  We did go ahead and do a CT scan today. The CT T scan did show some progressive disease. She has the left breast primary which is slightly larger. She has progressive pulmonary metastasis. There is some mediastinal lymph nodes which appear to be new. She has a new T6 compression fracture. There is some mild progression of osseous metastasis with enlargement of a left iliac lesion. She has some hydronephrosis.  Overall, her performance status is still quite poor. The performance status at best is ECOG 3.   It is hard to say how much she is eating. She does not appear to have any pain. She's having no nausea or vomiting.  She just is not able to do all that much. I'm still amazed that she is living at home.  Her last CA 27.29 was 684. Today, it is 540.    I have been trying to get a HER-2 analysis from a liquid biopsy. We are trying this again today.    Medications:  Current Outpatient Prescriptions:  .  aspirin 81 MG chewable tablet, Chew by mouth daily., Disp: , Rfl:  .  cephALEXin (KEFLEX) 500 MG capsule, Take 1 capsule (500 mg total) by mouth 2 (two) times daily., Disp: 14 capsule, Rfl: 0 .  exemestane (AROMASIN) 25 MG tablet, Take 1 tablet (25 mg total) by mouth daily after breakfast., Disp: 30 tablet, Rfl: 3 .  feeding supplement, ENSURE ENLIVE, (ENSURE ENLIVE) LIQD, Take 237 mLs by mouth 2 (two) times daily between meals., Disp: 237 mL, Rfl: 12 .  folic acid (FOLVITE) 1 MG tablet, Take 1 tablet (1 mg total) by mouth daily., Disp: 30 tablet, Rfl: 3 .  glimepiride (AMARYL) 1 MG tablet, Take 1 tablet (1 mg total) by mouth daily with breakfast., Disp:  30 tablet, Rfl: 2 .  mirtazapine (REMERON SOL-TAB) 15 MG disintegrating tablet, Take 1 tablet (15 mg total) by mouth at bedtime., Disp: 30 tablet, Rfl: 3 .  Multiple Vitamin (MULTIVITAMIN WITH MINERALS) TABS tablet, Take 1 tablet by mouth daily., Disp: 30 tablet, Rfl: 3 .  sodium bicarbonate 650 MG tablet, Take 1 tablet (650 mg total) by mouth 2 (two) times daily., Disp: 60 tablet, Rfl: 3  Allergies: No Known Allergies  Past Medical History, Surgical history, Social history, and Family History were reviewed and updated.  Review of Systems: As above   Physical Exam:  oral temperature is 97.8 F (36.6 C). Her blood pressure is 81/49 (abnormal) and her pulse is 66. Her respiration is 16 and oxygen saturation is 100%.   Wt Readings from Last 3 Encounters:  09/16/16 140 lb (63.5 kg)  09/04/16 140 lb (63.5 kg)  09/01/16 134 lb 7.7 oz (61 kg)     Chronically ill appearing, petite, African-American female in no obvious distress. Head and neck exam shows no ocular or oral lesions. She has ptosis of the left eye. I think she may have a traumatic cataract of the left eye. She has had no adenopathy in the neck. Lungs are relatively clear bilaterally. Cardiac exam regular rate and rhythm with a normal S1 and S2. There are no murmurs, rubs  or bruits. Abdomen is soft. She has good bowel sounds. There is no fluid wave. There is no palpable liver or spleen tip. Breast exam shows large mass in the outer portion of the left breast. I cannot palpate any obvious left axillary adenopathy. Right breast is unremarkable. Extremities shows no clubbing, cyanosis or edema. She has some mild atrophy of her extremities. Neurological exam shows no focal neurological deficits. Skin exam shows no rashes, ecchymosis or petechia.   Lab Results  Component Value Date   WBC 5.9 11/20/2016   HGB 13.1 11/20/2016   HCT 41.2 11/20/2016   MCV 87 11/20/2016   PLT 212 11/20/2016     Chemistry      Component Value Date/Time    NA 140 11/20/2016 1348   NA 136 10/23/2016 1129   K 5.0 No visible hemolysis (H) 11/20/2016 1348   K 5.3 (H) 10/23/2016 1129   CL 113 (H) 11/20/2016 1348   CO2 20 11/20/2016 1348   CO2 14 (L) 10/23/2016 1129   BUN 56 (H) 11/20/2016 1348   BUN 64.3 (H) 10/23/2016 1129   CREATININE 2.0 (H) 11/20/2016 1348   CREATININE 2.4 (H) 10/23/2016 1129      Component Value Date/Time   CALCIUM 9.7 11/20/2016 1348   CALCIUM 10.2 10/23/2016 1129   ALKPHOS 135 (H) 11/20/2016 1348   ALKPHOS 218 (H) 10/23/2016 1129   AST 21 11/20/2016 1348   AST 11 10/23/2016 1129   ALT 9 (L) 11/20/2016 1348   ALT <6 10/23/2016 1129   BILITOT 0.50 11/20/2016 1348   BILITOT 0.31 10/23/2016 1129         Impression and Plan: Becky Pena is A 70 year old postmenopausal Afro-American female. She has a large left breast mass. She had a left axillary lymph node biopsied. She cannot have the breast mass itself biopsied because radiology would not do it at the hospital and says she would have to go to the breast center.  I'm glad that her sugar is better. I did put her on Amaryl last time she was here.  She is just not a candidate for systemic chemotherapy in her current condition. That is why I'm trying to get a HER-2 analysis.  I talked her about this. I explained to her that the only treatment options that we have our chemotherapy. She really does not want any chemotherapy. I can understand this given her overall poor condition.  I think that hospice would be helpful but she does not want to consider this.  Maybe the liquid biopsied that we will get today will help [place] out. If not, then we may have to get another biopsy of the breast mass.  I just want to try to help her quality of life. To me, this is most important.  We will get her back here in another 3 weeks. Hopefully, her performance status may improve a little bit.  Again, she just has a poor performance status and because of this, she just is not a  candidate for systemic chemotherapy. I think the only thing that we might be able to use given her poor performance status would be Navelbine or gemcitabine as a single agent.  I spent about 45 minutes with her today.   Volanda Napoleon, MD 12/26/20177:38 AM

## 2016-12-07 ENCOUNTER — Other Ambulatory Visit: Payer: Self-pay | Admitting: Hematology & Oncology

## 2016-12-07 ENCOUNTER — Encounter: Payer: Self-pay | Admitting: Hematology & Oncology

## 2016-12-07 DIAGNOSIS — C50912 Malignant neoplasm of unspecified site of left female breast: Secondary | ICD-10-CM

## 2016-12-07 MED ORDER — ABEMACICLIB 150 MG PO TABS: 150.0000 mg | ORAL_TABLET | Freq: Two times a day (BID) | ORAL | 4 refills | Status: DC

## 2016-12-07 MED FILL — VERZENIO 150 MG TAB: 150 | 28 days supply | Qty: 56 | Fill #0

## 2016-12-09 ENCOUNTER — Telehealth: Payer: Self-pay | Admitting: *Deleted

## 2016-12-09 NOTE — Telephone Encounter (Signed)
Dr Marin Olp started patient on VERZENIO tablets for her breast cancer. Medication sent to the pharmacy downstairs and it was filled with a copay slightly less than $8. Called patient to notify her and she states she is unable to pick it up. She is scheduled for an MD visit this Friday and states she will pick up then.  Dr Marin Olp notified.

## 2016-12-11 ENCOUNTER — Encounter: Payer: Self-pay | Admitting: Hematology & Oncology

## 2016-12-11 ENCOUNTER — Other Ambulatory Visit (HOSPITAL_BASED_OUTPATIENT_CLINIC_OR_DEPARTMENT_OTHER): Payer: Medicare Other

## 2016-12-11 ENCOUNTER — Ambulatory Visit (HOSPITAL_BASED_OUTPATIENT_CLINIC_OR_DEPARTMENT_OTHER): Payer: Medicare Other | Admitting: Hematology & Oncology

## 2016-12-11 ENCOUNTER — Ambulatory Visit: Payer: Medicare Other | Admitting: Hematology & Oncology

## 2016-12-11 VITALS — BP 130/78 | HR 74 | Resp 18

## 2016-12-11 DIAGNOSIS — Z171 Estrogen receptor negative status [ER-]: Secondary | ICD-10-CM | POA: Diagnosis not present

## 2016-12-11 DIAGNOSIS — Z7189 Other specified counseling: Secondary | ICD-10-CM

## 2016-12-11 DIAGNOSIS — C50912 Malignant neoplasm of unspecified site of left female breast: Secondary | ICD-10-CM

## 2016-12-11 HISTORY — DX: Other specified counseling: Z71.89

## 2016-12-11 LAB — CBC WITH DIFFERENTIAL (CANCER CENTER ONLY)
BASO#: 0 10*3/uL (ref 0.0–0.2)
BASO%: 0.6 % (ref 0.0–2.0)
EOS%: 1.4 % (ref 0.0–7.0)
Eosinophils Absolute: 0.1 10*3/uL (ref 0.0–0.5)
HCT: 39.1 % (ref 34.8–46.6)
HEMOGLOBIN: 12.2 g/dL (ref 11.6–15.9)
LYMPH#: 2 10*3/uL (ref 0.9–3.3)
LYMPH%: 39.6 % (ref 14.0–48.0)
MCH: 27.2 pg (ref 26.0–34.0)
MCHC: 31.2 g/dL — AB (ref 32.0–36.0)
MCV: 87 fL (ref 81–101)
MONO#: 0.4 10*3/uL (ref 0.1–0.9)
MONO%: 7.3 % (ref 0.0–13.0)
NEUT%: 51.1 % (ref 39.6–80.0)
NEUTROS ABS: 2.6 10*3/uL (ref 1.5–6.5)
Platelets: 258 10*3/uL (ref 145–400)
RBC: 4.49 10*6/uL (ref 3.70–5.32)
RDW: 13.6 % (ref 11.1–15.7)
WBC: 5.1 10*3/uL (ref 3.9–10.0)

## 2016-12-11 LAB — CMP (CANCER CENTER ONLY)
ALBUMIN: 3.1 g/dL — AB (ref 3.3–5.5)
ALT(SGPT): 11 U/L (ref 10–47)
AST: 19 U/L (ref 11–38)
Alkaline Phosphatase: 103 U/L — ABNORMAL HIGH (ref 26–84)
BILIRUBIN TOTAL: 0.5 mg/dL (ref 0.20–1.60)
BUN: 61 mg/dL — AB (ref 7–22)
CHLORIDE: 110 meq/L — AB (ref 98–108)
CO2: 22 meq/L (ref 18–33)
CREATININE: 2.6 mg/dL — AB (ref 0.6–1.2)
Calcium: 10 mg/dL (ref 8.0–10.3)
Glucose, Bld: 112 mg/dL (ref 73–118)
Potassium: 5.5 mEq/L — ABNORMAL HIGH (ref 3.3–4.7)
SODIUM: 141 meq/L (ref 128–145)
TOTAL PROTEIN: 7.5 g/dL (ref 6.4–8.1)

## 2016-12-11 NOTE — Progress Notes (Signed)
Hematology and Oncology Follow Up Visit  Stephana Morell [id number] 01-08-1946 71 y.o. 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 Bentley is back for follow-up. She is about the same. She is still living at home. She comes in a stretcher. She had to be brought in by ambulance.   I am still amazed that she is still living at home. I just cannot imagine that she is doing that much at home. I am not sure how she is making it at home.  We really cannot weigh her.  She says she is not hurting. She says that she is eating.  We have finally been able to get her Abemaciclib.  I gave this to her today. We brought it up from the pharmacy downstairs. 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 She is not a candidate for any chemotherapy.   She is not complaining of any cough or shortness of breath. She has not noted any further swelling of the mass in the left breast. There's been no swelling of the left arm.  She's had no diarrhea. She's had no dysuria. She's had no bleeding. She's had no fever.  Her faith remains incredibly strong. As always, we had a very good prayer session.  Her last CA 27.29 was 540 which was a little better.   Overall, her performance status is ECOG 3.   Medications:  Current Outpatient Prescriptions:  .  Abemaciclib (VERZENIO) 150 MG TABS, Take 150 mg by mouth 2 (two) times daily., Disp: 60 tablet, Rfl: 4 .  aspirin 81 MG chewable tablet, Chew by mouth daily., Disp: , Rfl:  .  feeding supplement, ENSURE ENLIVE, (ENSURE ENLIVE) LIQD, Take 237 mLs by mouth 2 (two) times daily between meals., Disp: 237 mL, Rfl: 12 .  folic acid (FOLVITE) 1 MG tablet, Take 1 tablet (1 mg total) by mouth daily., Disp: 30 tablet, Rfl: 3 .  glimepiride (AMARYL) 1 MG tablet, Take 1 tablet (1 mg  total) by mouth daily with breakfast., Disp: 30 tablet, Rfl: 2 .  mirtazapine (REMERON SOL-TAB) 15 MG disintegrating tablet, Take 1 tablet (15 mg total) by mouth at bedtime., Disp: 30 tablet, Rfl: 3 .  Multiple Vitamin (MULTIVITAMIN WITH MINERALS) TABS tablet, Take 1 tablet by mouth daily., Disp: 30 tablet, Rfl: 3 .  sodium bicarbonate 650 MG tablet, Take 1 tablet (650 mg total) by mouth 2 (two) times daily., Disp: 60 tablet, Rfl: 3  Allergies: No Known Allergies  Past Medical History, Surgical history, Social history, and Family History were reviewed and updated.  Review of Systems: As above   Physical Exam:  blood pressure is 130/78 and her pulse is 74. Her respiration is 18 and oxygen saturation is 95%.   Wt Readings from Last 3 Encounters:  09/16/16 140 lb (63.5 kg)  09/04/16 140 lb (63.5 kg)  09/01/16 134 lb 7.7 oz (61 kg)     Chronically ill appearing, petite, African-American female in no obvious distress. Head and neck exam shows no ocular or oral lesions. She has ptosis of the left eye. I think she may have a traumatic cataract of the left eye. She has had no adenopathy in the neck. Lungs are relatively clear bilaterally. Cardiac exam regular rate and rhythm with a normal S1 and S2. There  are no murmurs, rubs or bruits. Abdomen is soft. She has good bowel sounds. There is no fluid wave. There is no palpable liver or spleen tip. Breast exam shows large mass in the outer portion of the left breast. I cannot palpate any obvious left axillary adenopathy. Right breast is unremarkable. Extremities shows no clubbing, cyanosis or edema. She has some mild atrophy of her extremities. Neurological exam shows no focal neurological deficits. Skin exam shows no rashes, ecchymosis or petechia.   Lab Results  Component Value Date   WBC 5.1 12/11/2016   HGB 12.2 12/11/2016   HCT 39.1 12/11/2016   MCV 87 12/11/2016   PLT 258 12/11/2016     Chemistry      Component Value Date/Time   NA 141  12/11/2016 1206   NA 136 10/23/2016 1129   K 5.5 (H) 12/11/2016 1206   K 5.3 (H) 10/23/2016 1129   CL 110 (H) 12/11/2016 1206   CO2 22 12/11/2016 1206   CO2 14 (L) 10/23/2016 1129   BUN 61 (H) 12/11/2016 1206   BUN 64.3 (H) 10/23/2016 1129   CREATININE 2.6 (H) 12/11/2016 1206   CREATININE 2.4 (H) 10/23/2016 1129      Component Value Date/Time   CALCIUM 10.0 12/11/2016 1206   CALCIUM 10.2 10/23/2016 1129   ALKPHOS 103 (H) 12/11/2016 1206   ALKPHOS 218 (H) 10/23/2016 1129   AST 19 12/11/2016 1206   AST 11 10/23/2016 1129   ALT 11 12/11/2016 1206   ALT <6 10/23/2016 1129   BILITOT 0.50 12/11/2016 1206   BILITOT 0.31 10/23/2016 1129         Impression and Plan: Ms. Strauch is A 71 year old postmenopausal Afro-American female. She has a large left breast mass. She had a left axillary lymph node biopsied. She cannot have the breast mass itself biopsied because radiology would not do it at the hospital and says she would have to go to the breast center.  I truly hope that the Abemaciclib will help her. Again, I got this for her myself. Actually when nurses went downstairs to get it. I opened up each carton of medicine. She got 4 cartons. Each carton is 1 week's worth of medication. I opened each one up for her so she would not have to do this and showed her each days supply.  She just has a very marginal performance status. She is not a candidate for chemotherapy. I have talked to her at length about her situation. I have taught her at length about the fact that when she has is treatable but not curable. I told her that she really has to get in better shape if we are trying to be aggressive. She understands this. She still wants to try treatment. Again she knows that this is not curable.   I noticed that with her labs today, her potassium is higher. Her creatinine is higher. I talked to her about this. She has refused to go to the emergency room or get IV fluids to help. She just wants to  be able to go home.  I have told her at length, that she has a disease that is not curable. We are just trying to treat her to try to improve her performance status and to try to help her quality of life. She has a strong faith and knows that God will help her and God will get rid of her cancer.  Somehow, we will have to try to get lab work on her  early next week so we can monitor the potassium and kidney function. It is obviously very hard for her to come to the office as an ambulance brings her.   I have expressed to her my concern with her being home. Again she just wants to be home. She is with her mother.   I will try to get her back to see [place] in another 2 or 3 weeks. S[place], it would not surprise me if she ends up in the hospital sooner.   I spent about 45 minutes with her today.   Volanda Napoleon, MD 1/12/20186:09 PM

## 2016-12-12 LAB — CANCER ANTIGEN 27.29: CA 27.29: 543.4 U/mL — ABNORMAL HIGH (ref 0.0–38.6)

## 2016-12-14 ENCOUNTER — Other Ambulatory Visit: Payer: Self-pay | Admitting: *Deleted

## 2016-12-14 MED ORDER — UNABLE TO FIND: 0 refills | Status: DC

## 2016-12-24 ENCOUNTER — Other Ambulatory Visit: Payer: Self-pay | Admitting: Hematology & Oncology

## 2016-12-24 ENCOUNTER — Telehealth: Payer: Self-pay | Admitting: *Deleted

## 2016-12-24 ENCOUNTER — Telehealth: Payer: Self-pay | Admitting: Hematology & Oncology

## 2016-12-24 NOTE — Telephone Encounter (Signed)
Patient c/o multiple side effects from her VERZENIO. She states the first week was fine, however the second week has caused her multiple problems including diarrhea, heart burn, nausea and vomiting. She hasn't taken any prn meds to help with symptom management. She would like to speak to Dr Marin Olp.   Dr Marin Olp attempted to call patient however the call went to voice mail. He would like patient to stop VERZENIO.   Attempted to call patient x two. Was able to speak to patient on second attempt. Instructed patient to stop medication. She understood. Also confirmed with patient her next scheduled appointment on 12/30/16. Patient states she will be sure to make it.   She will call back if symptoms don't resolve with discontinuation of medication.

## 2016-12-24 NOTE — Telephone Encounter (Signed)
Ms. Ermel had called yesterday. She says that the Verzenio that we had prescribed for her for the breast cancer was causing her to have problems with diarrhea and heartburn and nausea. I left a message for her. I told her in the message to stop taking the Verzenio. I don't think it would hurt her to stop taking it for right now.  I told her to try to get to the office today so we can see her and maybe give her some IV fluids and some IV nausea medicine. I know that she is somewhat frail and that she does not have a lot of "reserve".  We will have to call her back since she did not talk to me personally. I want to make sure that we stay on top of this. She, again, does not have a lot of reserve to be able to handle any kind of stress on her body.  Again, I told her to stop taking the Verzenio we will follow-up with her this morning.  Lattie Haw, MD

## 2016-12-30 ENCOUNTER — Other Ambulatory Visit: Payer: Self-pay

## 2016-12-30 ENCOUNTER — Ambulatory Visit (HOSPITAL_BASED_OUTPATIENT_CLINIC_OR_DEPARTMENT_OTHER): Payer: Medicare Other | Admitting: Hematology & Oncology

## 2016-12-30 ENCOUNTER — Telehealth: Payer: Self-pay | Admitting: *Deleted

## 2016-12-30 ENCOUNTER — Other Ambulatory Visit (HOSPITAL_BASED_OUTPATIENT_CLINIC_OR_DEPARTMENT_OTHER): Payer: Medicare Other

## 2016-12-30 ENCOUNTER — Encounter (HOSPITAL_BASED_OUTPATIENT_CLINIC_OR_DEPARTMENT_OTHER): Payer: Self-pay | Admitting: *Deleted

## 2016-12-30 ENCOUNTER — Other Ambulatory Visit: Payer: Self-pay | Admitting: *Deleted

## 2016-12-30 ENCOUNTER — Inpatient Hospital Stay (HOSPITAL_BASED_OUTPATIENT_CLINIC_OR_DEPARTMENT_OTHER)
Admission: EM | Admit: 2016-12-30 | Discharge: 2017-01-01 | DRG: 682 | Disposition: A | Discharge status: Home / Self Care | Payer: Medicare Other | Attending: Cardiology | Admitting: Cardiology

## 2016-12-30 VITALS — BP 101/62 | HR 80 | Temp 98.3°F

## 2016-12-30 DIAGNOSIS — N19 Unspecified kidney failure: Secondary | ICD-10-CM | POA: Diagnosis not present

## 2016-12-30 DIAGNOSIS — E875 Hyperkalemia: Secondary | ICD-10-CM | POA: Diagnosis present

## 2016-12-30 DIAGNOSIS — N179 Acute kidney failure, unspecified: Secondary | ICD-10-CM | POA: Diagnosis present

## 2016-12-30 DIAGNOSIS — C50912 Malignant neoplasm of unspecified site of left female breast: Secondary | ICD-10-CM | POA: Diagnosis present

## 2016-12-30 DIAGNOSIS — N390 Urinary tract infection, site not specified: Secondary | ICD-10-CM | POA: Diagnosis present

## 2016-12-30 DIAGNOSIS — N189 Chronic kidney disease, unspecified: Secondary | ICD-10-CM | POA: Diagnosis present

## 2016-12-30 DIAGNOSIS — E43 Unspecified severe protein-calorie malnutrition: Secondary | ICD-10-CM | POA: Diagnosis present

## 2016-12-30 DIAGNOSIS — Z7982 Long term (current) use of aspirin: Secondary | ICD-10-CM

## 2016-12-30 DIAGNOSIS — N170 Acute kidney failure with tubular necrosis: Secondary | ICD-10-CM

## 2016-12-30 DIAGNOSIS — E1122 Type 2 diabetes mellitus with diabetic chronic kidney disease: Secondary | ICD-10-CM | POA: Diagnosis present

## 2016-12-30 DIAGNOSIS — B9620 Unspecified Escherichia coli [E. coli] as the cause of diseases classified elsewhere: Secondary | ICD-10-CM | POA: Diagnosis present

## 2016-12-30 DIAGNOSIS — C799 Secondary malignant neoplasm of unspecified site: Secondary | ICD-10-CM

## 2016-12-30 DIAGNOSIS — Z681 Body mass index (BMI) 19 or less, adult: Secondary | ICD-10-CM | POA: Diagnosis not present

## 2016-12-30 DIAGNOSIS — E860 Dehydration: Secondary | ICD-10-CM | POA: Diagnosis present

## 2016-12-30 DIAGNOSIS — C50919 Malignant neoplasm of unspecified site of unspecified female breast: Secondary | ICD-10-CM | POA: Diagnosis not present

## 2016-12-30 DIAGNOSIS — Z171 Estrogen receptor negative status [ER-]: Secondary | ICD-10-CM | POA: Diagnosis not present

## 2016-12-30 DIAGNOSIS — I129 Hypertensive chronic kidney disease with stage 1 through stage 4 chronic kidney disease, or unspecified chronic kidney disease: Secondary | ICD-10-CM | POA: Diagnosis present

## 2016-12-30 LAB — BASIC METABOLIC PANEL
ANION GAP: 8 (ref 5–15)
BUN: 87 mg/dL — ABNORMAL HIGH (ref 6–20)
CO2: 18 mmol/L — AB (ref 22–32)
Calcium: 9.3 mg/dL (ref 8.9–10.3)
Chloride: 107 mmol/L (ref 101–111)
Creatinine, Ser: 5 mg/dL — ABNORMAL HIGH (ref 0.44–1.00)
GFR calc non Af Amer: 8 mL/min — ABNORMAL LOW (ref >60)
GFR, EST AFRICAN AMERICAN: 9 mL/min — AB (ref >60)
Glucose, Bld: 97 mg/dL (ref 65–99)
Potassium: 6.1 mmol/L — ABNORMAL HIGH (ref 3.5–5.1)
Sodium: 133 mmol/L — ABNORMAL LOW (ref 135–145)

## 2016-12-30 LAB — CBC WITH DIFFERENTIAL (CANCER CENTER ONLY)
BASO#: 0 10*3/uL (ref 0.0–0.2)
BASO%: 0.3 % (ref 0.0–2.0)
EOS%: 0.6 % (ref 0.0–7.0)
Eosinophils Absolute: 0 10*3/uL (ref 0.0–0.5)
HCT: 36.9 % (ref 34.8–46.6)
HEMOGLOBIN: 11.6 g/dL (ref 11.6–15.9)
LYMPH#: 1.6 10*3/uL (ref 0.9–3.3)
LYMPH%: 48.9 % — ABNORMAL HIGH (ref 14.0–48.0)
MCH: 26.8 pg (ref 26.0–34.0)
MCHC: 31.4 g/dL — AB (ref 32.0–36.0)
MCV: 85 fL (ref 81–101)
MONO#: 0.2 10*3/uL (ref 0.1–0.9)
MONO%: 7.4 % (ref 0.0–13.0)
NEUT%: 42.8 % (ref 39.6–80.0)
NEUTROS ABS: 1.4 10*3/uL — AB (ref 1.5–6.5)
RBC: 4.33 10*6/uL (ref 3.70–5.32)
RDW: 14.1 % (ref 11.1–15.7)
WBC: 3.2 10*3/uL — AB (ref 3.9–10.0)

## 2016-12-30 LAB — CMP (CANCER CENTER ONLY)
ALBUMIN: 3 g/dL — AB (ref 3.3–5.5)
ALT(SGPT): 10 U/L (ref 10–47)
AST: 15 U/L (ref 11–38)
Alkaline Phosphatase: 87 U/L — ABNORMAL HIGH (ref 26–84)
BILIRUBIN TOTAL: 0.4 mg/dL (ref 0.20–1.60)
BUN, Bld: 85 mg/dL — ABNORMAL HIGH (ref 7–22)
CHLORIDE: 105 meq/L (ref 98–108)
CO2: 20 meq/L (ref 18–33)
CREATININE: 5.1 mg/dL — AB (ref 0.6–1.2)
Calcium: 9.7 mg/dL (ref 8.0–10.3)
Glucose, Bld: 106 mg/dL (ref 73–118)
Potassium: 5.8 mEq/L — ABNORMAL HIGH (ref 3.3–4.7)
SODIUM: 142 meq/L (ref 128–145)
TOTAL PROTEIN: 7.6 g/dL (ref 6.4–8.1)

## 2016-12-30 LAB — CBG MONITORING, ED
GLUCOSE-CAPILLARY: 117 mg/dL — AB (ref 65–99)
Glucose-Capillary: 86 mg/dL (ref 65–99)

## 2016-12-30 MED ORDER — SODIUM POLYSTYRENE SULFONATE 15 GM/60ML PO SUSP
15.0000 g | Freq: Once | ORAL | Status: AC
  Administered 2016-12-31: 15 g via ORAL
  Filled 2016-12-30 (×2): qty 60

## 2016-12-30 MED ORDER — INSULIN ASPART 100 UNIT/ML IV SOLN
4.0000 [IU] | Freq: Once | INTRAVENOUS | Status: AC
  Administered 2016-12-30: 4 [IU] via INTRAVENOUS
  Filled 2016-12-30: qty 1

## 2016-12-30 MED ORDER — GLUCERNA PO LIQD: 237.0000 mL | Freq: Three times a day (TID) | ORAL | 3 refills | Status: AC

## 2016-12-30 MED ORDER — DEXTROSE 50 % IV SOLN
1.0000 | Freq: Once | INTRAVENOUS | Status: AC
  Administered 2016-12-30: 30 mL via INTRAVENOUS
  Filled 2016-12-30: qty 50

## 2016-12-30 MED ORDER — UNABLE TO FIND: 3 refills | Status: DC

## 2016-12-30 MED ORDER — SODIUM CHLORIDE 0.9 % IV BOLUS (SEPSIS)
1000.0000 mL | Freq: Once | INTRAVENOUS | Status: AC
  Administered 2016-12-30: 1000 mL via INTRAVENOUS

## 2016-12-30 NOTE — ED Notes (Signed)
Pt states "I really didn't come for this, I was upstairs at my appointment, the ambulance service brought me down here. I don't have pains. I had been taking medication that gave me diarrhea that I have not taken anymore because he told me to stop it.

## 2016-12-30 NOTE — ED Notes (Signed)
I attempted by Trula Slade.

## 2016-12-30 NOTE — ED Notes (Signed)
Attempted IV x 1 with 24G in left posterior FA with no success

## 2016-12-30 NOTE — ED Triage Notes (Signed)
Pt seen by Dr. Marin Olp. Creatinine elevated. Sent to ED for evaluation for admission

## 2016-12-30 NOTE — Progress Notes (Signed)
Patient arrived from the [place] in [place] notified the answering service of Dr. Vilinda Boehringer.  The patient currently does not have any IV access.  IV team was notified.

## 2016-12-30 NOTE — ED Provider Notes (Addendum)
[facility] Provider Note   CSN: [id number] Arrival date & time: 12/30/16  1546     History   Chief Complaint Chief Complaint  Patient presents with  . Abnormal Lab    HPI Becky Pena is a 71 y.o. female.  HPI Patient was sent down from the cancer center. Found to have renal failure with a creatinine of 5.1. She has metastatic breast cancer. She was on a medicine around 3 weeks ago that did her diarrhea. States since then she has had decreased appetite 2 but no longer having diarrhea. States she's been a little weak all over. No fevers or chills. Some slight lightheadedness. No chest pain. No trouble breathing. No dysuria.   Past Medical History:  Diagnosis Date  . Breast cancer metastasized to multiple sites, left ([place]) 09/02/2016  . Diabetes mellitus without complication ([place])   . Goals of care, counseling/discussion 12/11/2016    Patient Active Problem List   Diagnosis Date Noted  . Renal failure 12/30/2016  . Goals of care, counseling/discussion 12/11/2016  . Breast cancer metastasized to multiple sites, left ([place]) 09/02/2016  . Acute renal failure (ARF) ([place]) 08/23/2016    Past Surgical History:  Procedure Laterality Date  . EYE SURGERY     LEFT EYE     OB History    No data available       Home Medications    Prior to Admission medications   Medication Sig Start Date End Date Taking? Authorizing Provider  Abemaciclib (VERZENIO) 150 MG TABS Take 150 mg by mouth 2 (two) times daily. 12/07/16   Volanda Napoleon, MD  aspirin 81 MG chewable tablet Chew by mouth daily.    Historical Provider, MD  feeding supplement, ENSURE ENLIVE, (ENSURE ENLIVE) LIQD Take 237 mLs by mouth 2 (two) times daily between meals. 09/01/16   Charolette Forward, MD  folic acid (FOLVITE) 1 MG tablet Take 1 tablet (1 mg total) by mouth daily. 09/01/16   Charolette Forward, MD  glimepiride (AMARYL) 1 MG tablet Take 1 tablet (1 mg total) by mouth daily with breakfast. 10/23/16   Volanda Napoleon, MD  GLUCERNA [facility]) LIQD Take 237 mLs by mouth 3 (three) times daily between meals. 12/30/16   Volanda Napoleon, MD  mirtazapine (REMERON SOL-TAB) 15 MG disintegrating tablet Take 1 tablet (15 mg total) by mouth at bedtime. 09/01/16   Charolette Forward, MD  Multiple Vitamin (MULTIVITAMIN WITH MINERALS) TABS tablet Take 1 tablet by mouth daily. 09/01/16   Charolette Forward, MD  sodium bicarbonate 650 MG tablet Take 1 tablet (650 mg total) by mouth 2 (two) times daily. 09/01/16   Charolette Forward, MD  UNABLE TO FIND Please draw BMP today and fax results to Dr Marin Olp at [phone] 12/14/16   Volanda Napoleon, MD  UNABLE TO FIND Please dispense bed pads for home use related to patient incontinence. 12/30/16   Volanda Napoleon, MD    Family History No family history on file.  Social History Social History  Substance Use Topics  . Smoking status: Never Smoker  . Smokeless tobacco: Never Used  . Alcohol use No     Allergies   Patient has no known allergies.   Review of Systems Review of Systems  Constitutional: Positive for appetite change and fatigue.  HENT: Negative for congestion.   Respiratory: Negative for shortness of breath.   Cardiovascular: Negative for chest pain.  Gastrointestinal: Positive for diarrhea. Negative for abdominal pain.  Genitourinary: Negative for dysuria.  Musculoskeletal: Negative for back pain.  Skin: Negative for wound.  Neurological: Positive for light-headedness.  Psychiatric/Behavioral: Negative for confusion.     Physical Exam Updated Vital Signs BP 121/90   Pulse 80   Temp 98.3 F (36.8 C) (Oral)   Resp 22   Ht 5\' 4"  (1.626 m)   Wt 140 lb (63.5 kg)   SpO2 98%   BMI 24.03 kg/m   Physical Exam  Constitutional: She appears well-developed.  HENT:  Head: Atraumatic.  Eyes: Pupils are equal, round, and reactive to light.  Neck: Neck supple.  Cardiovascular: Normal rate.   Pulmonary/Chest: Effort normal.  Abdominal: Soft.  Musculoskeletal:  She exhibits no edema.  Neurological: She is alert.  Skin: Skin is warm. Capillary refill takes less than 2 seconds.     ED Treatments / Results  Labs (all labs ordered are listed, but only abnormal results are displayed) Labs Reviewed  BASIC METABOLIC PANEL - Abnormal; Notable for the following:       Result Value   Sodium 133 (*)    Potassium 6.1 (*)    CO2 18 (*)    BUN 87 (*)    Creatinine, Ser 5.00 (*)    GFR calc non Af Amer 8 (*)    GFR calc Af Amer 9 (*)    All other components within normal limits  URINALYSIS, ROUTINE W REFLEX MICROSCOPIC  CBG MONITORING, ED    EKG  EKG Interpretation  Date/Time:  Wednesday December 30 2016 16:44:57 EST Ventricular Rate:  86 PR Interval:    QRS Duration: 83 QT Interval:  367 QTC Calculation: 439 R Axis:   -38 Text Interpretation:  Sinus rhythm Left axis deviation Abnormal R-wave progression, early transition Consider anterior infarct Confirmed by Alvino Chapel  MD, Ovid Curd [id number]) on 12/30/2016 5:16:52 PM       Radiology No results found.  Procedures Procedures (including critical care time)  Medications Ordered in ED Medications  sodium polystyrene (KAYEXALATE) 15 GM/60ML suspension 15 g (not administered)  sodium chloride 0.9 % bolus 1,000 mL (0 mLs Intravenous Stopped 12/30/16 1921)  insulin aspart (novoLOG) injection 4 Units (4 Units Intravenous Given 12/30/16 2110)  dextrose 50 % solution 50 mL (30 mLs Intravenous Given 12/30/16 2111)     Initial Impression / Assessment and Plan / ED Course  I have reviewed the triage vital signs and the nursing notes.  Pertinent labs & imaging results that were available during my care of the patient were reviewed by me and considered in my medical decision making (see chart for details).     Patient presents with renal failure. Likely due to falling status and overall illness. Creatinine of 5 and potassium of 6.1. EKG does not show changes. Patient may be trending towards palliative  care. Patient will be admitted to Dr. Dorcas Mcmurray. He recommended more aggressive treatment of the potassium. Will give insulin glucose and small dose of Kayexalate. Fluid given. Ad[facility] to stepdown at [facility]. Per Dr. Marin Olp the patient is not a dialysis candidate.  Patient has been difficult to get IV access on. She has had multiple attempts at peripheral IV and has had 2 ultrasound-guided peripheral IVs both of which ended up infiltrating. Overall I think she does not need a central line. May need IV team to place IV by think at this point she can be transferred without IV access. She is likely heading towards palliative care and does not want a lot done. Kayexalate has been given.  Final Clinical Impressions(s) /  ED Diagnoses   Final diagnoses:  Renal failure, unspecified chronicity  Hyperkalemia  Metastatic cancer [facility])    New Prescriptions New Prescriptions   No medications on file     Davonna Belling, MD 12/30/16 1902    Davonna Belling, MD 12/30/16 2148

## 2016-12-30 NOTE — Progress Notes (Signed)
Hematology and Oncology Follow Up Visit  Becky Pena [id number] 1945/12/15 71 y.o. 12/30/2016   Principle Diagnosis:   Metastatic breast cancer- ER -/Her2 (-) -- CDK6/PIK3CA  Current Therapy:    Abemaciclib '150mg'$  po BID - start 12/12/2016     Interim History:  Ms. Salomone is back for follow-up. She is not looking all that good. She looks quite frail. I know that she stopped the Abemaciclib a week or so ago because of some diarrhea.  I still have not sure how she is making it at home. She lives with her mother. Her mother does all the cooking.  She has no nausea right now. She has no bleeding. She has no diarrhea. She says she is urinating.  She does not complain of pain.  There is no bleeding.  Overall, her performance status is ECOG 3.   Medications:  Current Outpatient Prescriptions:  .  Abemaciclib (VERZENIO) 150 MG TABS, Take 150 mg by mouth 2 (two) times daily., Disp: 60 tablet, Rfl: 4 .  aspirin 81 MG chewable tablet, Chew by mouth daily., Disp: , Rfl:  .  feeding supplement, ENSURE ENLIVE, (ENSURE ENLIVE) LIQD, Take 237 mLs by mouth 2 (two) times daily between meals., Disp: 237 mL, Rfl: 12 .  folic acid (FOLVITE) 1 MG tablet, Take 1 tablet (1 mg total) by mouth daily., Disp: 30 tablet, Rfl: 3 .  glimepiride (AMARYL) 1 MG tablet, Take 1 tablet (1 mg total) by mouth daily with breakfast., Disp: 30 tablet, Rfl: 2 .  GLUCERNA (GLUCERNA) LIQD, Take 237 mLs by mouth 3 (three) times daily between meals., Disp: 60 Can, Rfl: 3 .  mirtazapine (REMERON SOL-TAB) 15 MG disintegrating tablet, Take 1 tablet (15 mg total) by mouth at bedtime., Disp: 30 tablet, Rfl: 3 .  Multiple Vitamin (MULTIVITAMIN WITH MINERALS) TABS tablet, Take 1 tablet by mouth daily., Disp: 30 tablet, Rfl: 3 .  sodium bicarbonate 650 MG tablet, Take 1 tablet (650 mg total) by mouth 2 (two) times daily., Disp: 60 tablet, Rfl: 3 .  UNABLE TO FIND, Please draw BMP today and fax results to Dr Marin Olp at [phone],  Disp: 1 each, Rfl: 0 .  UNABLE TO FIND, Please dispense bed pads for home use related to patient incontinence., Disp: 60 each, Rfl: 3  Allergies: No Known Allergies  Past Medical History, Surgical history, Social history, and Family History were reviewed and updated.  Review of Systems: As above   Physical Exam:  oral temperature is 98.3 F (36.8 C). Her blood pressure is 101/62 and her pulse is 80.   Wt Readings from Last 3 Encounters:  09/16/16 140 lb (63.5 kg)  09/04/16 140 lb (63.5 kg)  09/01/16 134 lb 7.7 oz (61 kg)     Chronically ill appearing, petite, African-American female in no obvious distress. Head and neck exam shows no ocular or oral lesions. She has ptosis of the left eye. I think she may have a traumatic cataract of the left eye. She has had no adenopathy in the neck. Lungs are relatively clear bilaterally. Cardiac exam regular rate and rhythm with a normal S1 and S2. There are no murmurs, rubs or bruits. Abdomen is soft. She has good bowel sounds. There is no fluid wave. There is no palpable liver or spleen tip. Breast exam shows large mass in the outer portion of the left breast. I cannot palpate any obvious left axillary adenopathy. Right breast is unremarkable. Extremities shows no clubbing, cyanosis or edema. She has some mild  atrophy of her extremities. Neurological exam shows no focal neurological deficits. Skin exam shows no rashes, ecchymosis or petechia.   Lab Results  Component Value Date   WBC 3.2 (L) 12/30/2016   HGB 11.6 12/30/2016   HCT 36.9 12/30/2016   MCV 85 12/30/2016   PLT 138 Large platelets present (L) 12/30/2016     Chemistry      Component Value Date/Time   NA 142 12/30/2016 1355   NA 136 10/23/2016 1129   K 5.8 (H) 12/30/2016 1355   K 5.3 (H) 10/23/2016 1129   CL 105 12/30/2016 1355   CO2 20 12/30/2016 1355   CO2 14 (L) 10/23/2016 1129   BUN 85 (H) 12/30/2016 1355   BUN 64.3 (H) 10/23/2016 1129   CREATININE 5.1 (HH) 12/30/2016 1355     CREATININE 2.4 (H) 10/23/2016 1129      Component Value Date/Time   CALCIUM 9.7 12/30/2016 1355   CALCIUM 10.2 10/23/2016 1129   ALKPHOS 87 (H) 12/30/2016 1355   ALKPHOS 218 (H) 10/23/2016 1129   AST 15 12/30/2016 1355   AST 11 10/23/2016 1129   ALT 10 12/30/2016 1355   ALT <6 10/23/2016 1129   BILITOT 0.40 12/30/2016 1355   BILITOT 0.31 10/23/2016 1129         Impression and Plan: Ms. Hornung is A 71 year old postmenopausal Afro-American female. She has a large left breast mass. She had a left axillary lymph node biopsied. She cannot have the breast mass itself biopsied because radiology would not do it at the hospital and says she would have to go to the breast center.  At this point, I talked her for about 40 minutes. I tried to convince her to go into the hospital. She has renal failure. I don't of this is from dehydration. I have not seen with abemaciclib that this causes renal failure. It can cause an elevated creatinine. She has a normal liver tests which would be the most likely toxicity from abemaciclib.   She is very reluctant to go into the hospital. I told her that she does not go into the hospital, she likely will pass on at home. I think that with hyperkalemia, this will affect her heart and she would go into a lethal heart rhythm.  I'll try to talk to her at length in the past because of her overall frail condition. I don't know if she would be agreeable to hospice.  She finally did agree to go to the hospital. When she gets to the hospital, then we can talk to her about the end-of-life issues and comfort care and trying to make her life as comfortable as possible.  I realize that she is in a "tough" spot. She lives with her mom. I'm still time to figure out how she is able to make it at home just with she and her mom.   Volanda Napoleon, MD 1/31/20183:42 PM

## 2016-12-30 NOTE — ED Notes (Signed)
IV attempted by this RN x2 one with ultrasound. Unsuccessful. Another RN to attempt.

## 2016-12-30 NOTE — ED Notes (Signed)
ED Provider at bedside. 

## 2016-12-30 NOTE — ED Triage Notes (Signed)
Pt brought from home by [facility] for Dr. Hilaria Ota upstairs. He Sent to ED after Dr appt for eval b/c Creatinine was 5.1. Dr. Marin Olp spoke with Dr. Alvino Chapel by phone

## 2016-12-30 NOTE — ED Notes (Addendum)
Pt c/o pain with medication administration, no blood return from IV and it will not flush now.  Dr. Alvino Chapel aware, at this time he is not comfortable inserting another IV and states she will need to get access at [facility]

## 2016-12-30 NOTE — ED Notes (Signed)
IV removed. Warm compress placed.

## 2016-12-30 NOTE — Telephone Encounter (Signed)
Critical Value Creatinine 5.1 Dr Ennever notified. No orders at this time 

## 2016-12-30 NOTE — ED Notes (Signed)
IV that was started infiltrated.  Removed line and applied heat pack

## 2016-12-31 ENCOUNTER — Inpatient Hospital Stay (HOSPITAL_COMMUNITY): Payer: Medicare Other

## 2016-12-31 ENCOUNTER — Encounter (HOSPITAL_COMMUNITY): Payer: Self-pay | Admitting: *Deleted

## 2016-12-31 DIAGNOSIS — C50919 Malignant neoplasm of unspecified site of unspecified female breast: Secondary | ICD-10-CM

## 2016-12-31 DIAGNOSIS — Z171 Estrogen receptor negative status [ER-]: Secondary | ICD-10-CM

## 2016-12-31 DIAGNOSIS — N179 Acute kidney failure, unspecified: Principal | ICD-10-CM

## 2016-12-31 LAB — BASIC METABOLIC PANEL
Anion gap: 10 (ref 5–15)
Anion gap: 11 (ref 5–15)
BUN: 84 mg/dL — AB (ref 6–20)
BUN: 80 mg/dL — AB (ref 6–20)
CALCIUM: 9.3 mg/dL (ref 8.9–10.3)
CALCIUM: 8.9 mg/dL (ref 8.9–10.3)
CHLORIDE: 107 mmol/L (ref 101–111)
CO2: 17 mmol/L — ABNORMAL LOW (ref 22–32)
CO2: 15 mmol/L — AB (ref 22–32)
CREATININE: 4.81 mg/dL — AB (ref 0.44–1.00)
Chloride: 109 mmol/L (ref 101–111)
Creatinine, Ser: 4.83 mg/dL — ABNORMAL HIGH (ref 0.44–1.00)
GFR calc Af Amer: 10 mL/min — ABNORMAL LOW (ref >60)
GFR calc Af Amer: 10 mL/min — ABNORMAL LOW (ref >60)
GFR calc non Af Amer: 8 mL/min — ABNORMAL LOW (ref >60)
GFR, EST NON AFRICAN AMERICAN: 8 mL/min — AB (ref >60)
GLUCOSE: 200 mg/dL — AB (ref 65–99)
Glucose, Bld: 172 mg/dL — ABNORMAL HIGH (ref 65–99)
Potassium: 5.2 mmol/L — ABNORMAL HIGH (ref 3.5–5.1)
Potassium: 4.8 mmol/L (ref 3.5–5.1)
SODIUM: 134 mmol/L — AB (ref 135–145)
Sodium: 135 mmol/L (ref 135–145)

## 2016-12-31 LAB — CBC
HEMATOCRIT: 36.9 % (ref 36.0–46.0)
Hemoglobin: 11.6 g/dL — ABNORMAL LOW (ref 12.0–15.0)
MCH: 26.4 pg (ref 26.0–34.0)
MCHC: 31.4 g/dL (ref 30.0–36.0)
MCV: 83.9 fL (ref 78.0–100.0)
Platelets: 129 10*3/uL — ABNORMAL LOW (ref 150–400)
RBC: 4.4 MIL/uL (ref 3.87–5.11)
RDW: 14.5 % (ref 11.5–15.5)
WBC: 3.5 10*3/uL — ABNORMAL LOW (ref 4.0–10.5)

## 2016-12-31 LAB — GLUCOSE, CAPILLARY
GLUCOSE-CAPILLARY: 138 mg/dL — AB (ref 65–99)
GLUCOSE-CAPILLARY: 149 mg/dL — AB (ref 65–99)
GLUCOSE-CAPILLARY: 187 mg/dL — AB (ref 65–99)
Glucose-Capillary: 146 mg/dL — ABNORMAL HIGH (ref 65–99)

## 2016-12-31 LAB — CANCER ANTIGEN 27.29: CA 27.29: 639.4 U/mL — ABNORMAL HIGH (ref 0.0–38.6)

## 2016-12-31 MED ORDER — ASPIRIN 81 MG PO CHEW
81.0000 mg | CHEWABLE_TABLET | Freq: Every day | ORAL | Status: DC
  Administered 2016-12-31 – 2017-01-01 (×2): 81 mg via ORAL
  Filled 2016-12-31 (×2): qty 1

## 2016-12-31 MED ORDER — ENSURE ENLIVE PO LIQD
237.0000 mL | Freq: Two times a day (BID) | ORAL | Status: DC
  Administered 2016-12-31 – 2017-01-01 (×2): 237 mL via ORAL

## 2016-12-31 MED ORDER — SODIUM POLYSTYRENE SULFONATE 15 GM/60ML PO SUSP
30.0000 g | Freq: Once | ORAL | Status: AC
  Administered 2016-12-31: 30 g via ORAL
  Filled 2016-12-31: qty 120

## 2016-12-31 MED ORDER — SODIUM BICARBONATE 650 MG PO TABS
1300.0000 mg | ORAL_TABLET | Freq: Two times a day (BID) | ORAL | Status: DC
  Administered 2016-12-31 – 2017-01-01 (×2): 1300 mg via ORAL
  Filled 2016-12-31 (×2): qty 2

## 2016-12-31 MED ORDER — SODIUM CHLORIDE 0.9 % IV SOLN: INTRAVENOUS | Status: DC

## 2016-12-31 MED ORDER — GLUCERNA PO LIQD: 237.0000 mL | Freq: Three times a day (TID) | ORAL | Status: DC

## 2016-12-31 MED ORDER — SODIUM CHLORIDE 0.9 % IV SOLN
INTRAVENOUS | Status: DC
  Administered 2016-12-31 – 2017-01-01 (×4): via INTRAVENOUS

## 2016-12-31 MED ORDER — SODIUM BICARBONATE 650 MG PO TABS
650.0000 mg | ORAL_TABLET | Freq: Two times a day (BID) | ORAL | Status: DC
  Administered 2016-12-31 (×2): 650 mg via ORAL
  Filled 2016-12-31 (×2): qty 1

## 2016-12-31 MED ORDER — ADULT MULTIVITAMIN W/MINERALS CH: 1.0000 | ORAL_TABLET | Freq: Every day | ORAL | Status: DC

## 2016-12-31 MED ORDER — HEPARIN SODIUM (PORCINE) 5000 UNIT/ML IJ SOLN
5000.0000 [IU] | Freq: Three times a day (TID) | INTRAMUSCULAR | Status: DC
  Administered 2016-12-31 – 2017-01-01 (×4): 5000 [IU] via SUBCUTANEOUS
  Filled 2016-12-31 (×4): qty 1

## 2016-12-31 MED ORDER — MIRTAZAPINE 15 MG PO TBDP
15.0000 mg | ORAL_TABLET | Freq: Every day | ORAL | Status: DC
  Administered 2016-12-31: 15 mg via ORAL
  Filled 2016-12-31: qty 1

## 2016-12-31 MED ORDER — FOLIC ACID 1 MG PO TABS
1.0000 mg | ORAL_TABLET | Freq: Every day | ORAL | Status: DC
  Administered 2016-12-31 – 2017-01-01 (×2): 1 mg via ORAL
  Filled 2016-12-31 (×2): qty 1

## 2016-12-31 MED ORDER — INSULIN ASPART 100 UNIT/ML ~~LOC~~ SOLN
0.0000 [IU] | Freq: Three times a day (TID) | SUBCUTANEOUS | Status: DC
  Administered 2016-12-31: 1 [IU] via SUBCUTANEOUS
  Administered 2016-12-31: 2 [IU] via SUBCUTANEOUS
  Administered 2016-12-31 – 2017-01-01 (×2): 1 [IU] via SUBCUTANEOUS

## 2016-12-31 NOTE — Consult Note (Signed)
Referral MD  Reason for Referral: Metastatic breast cancer-triple negative; renal failure (acute on chronic)   Chief Complaint  Patient presents with  . Abnormal Lab  : I want to go home  HPI: Ms. Cutcher is well-known to me. She is a 71 year old African-American female. She has metastatic breast cancer. She was diagnosed in the summer of 2017. [place], she has triple negative disease. She has a very poor performance status and has never been a candidate for chemotherapy. She had some genetic studies done on her cancer. Her cancer does have a mutation at the CDK4/6 gene. I have put her on Verzenio which she has not tolerated.  I saw in the office yesterday. She had creatinine of 5.1. Her BUN was 85. Her potassium was 5.8. She just does not have a good grasp on the severity of her disease. I have talked her at length about her metastatic disease. She is not a candidate for any chemotherapy. She is not a candidate for hormonal therapy. Our only option would be Verzenio. She really does not want to take this.  All she does is want to go home.  I have tried to talk to her about end-of-life issues. She just does not understand all that well that she has a disease that we really cannot treat. She has other comorbid conditions. I told her that if she was not going to be admitted yesterday, that she would go home and pass on. Again, sure she really understood that.  She has an incredibly strong faith. She still feels that God will heal her. I have talked to her about this. I have told her that if God wanted her to be healed, then she would be getting better, her cancer would be getting smaller and she would not have kidney failure.  I have tried to talk to her about end-of-life issues with life-support. I explained to her about being kept alive on machines. I explained to her what that would entail. Unfortunately, she just cannot make a decision.  I then talked to her about hospice. I really think that  she needs hospice. Again, she is somewhat in denial and does not want hospice to come to her house.  She lives with her mother. I'm not sure how she is able to manage at home.  She will not go to a nursing home. She only wants to go home.  Her creatinine is a little bit better. Her BUN is a little bit better. Her potassium is much better.  I really don't think she eats much. She has had no nausea or vomiting. There's been no diarrhea.  I told her that if she tried taking the Verzeno once a day, she might do well.  I told her today that if we cannot help her, then she would not make it through March. Again I think there might be an element of denial.     Past Medical History:  Diagnosis Date  . Breast cancer metastasized to multiple sites, left ([place]) 09/02/2016  . Diabetes mellitus without complication ([place])   . Goals of care, counseling/discussion 12/11/2016  :  Past Surgical History:  Procedure Laterality Date  . EYE SURGERY     LEFT EYE   :   Current Facility-Administered Medications:  .  0.9 %  sodium chloride infusion, , Intravenous, Continuous, Charolette Forward, MD, Last Rate: 100 mL/hr at 12/31/16 0314 .  aspirin chewable tablet 81 mg, 81 mg, Oral, Daily, Charolette Forward, MD .  feeding  supplement (ENSURE ENLIVE) (ENSURE ENLIVE) liquid 237 mL, 237 mL, Oral, BID BM, Charolette Forward, MD .  folic acid (FOLVITE) tablet 1 mg, 1 mg, Oral, Daily, Charolette Forward, MD .  heparin injection 5,000 Units, 5,000 Units, Subcutaneous, Q8H, Charolette Forward, MD, 5,000 Units at 12/31/16 0609 .  insulin aspart (novoLOG) injection 0-9 Units, 0-9 Units, Subcutaneous, TID WC, Charolette Forward, MD .  mirtazapine (REMERON SOL-TAB) disintegrating tablet 15 mg, 15 mg, Oral, QHS, Charolette Forward, MD .  multivitamin with minerals tablet 1 tablet, 1 tablet, Oral, Daily, Charolette Forward, MD .  sodium bicarbonate tablet 650 mg, 650 mg, Oral, BID, Charolette Forward, MD, 650 mg at 12/31/16 0315:  . aspirin  81 mg Oral Daily   . feeding supplement (ENSURE ENLIVE)  237 mL Oral BID BM  . folic acid  1 mg Oral Daily  . heparin  5,000 Units Subcutaneous Q8H  . insulin aspart  0-9 Units Subcutaneous TID WC  . mirtazapine  15 mg Oral QHS  . multivitamin with minerals  1 tablet Oral Daily  . sodium bicarbonate  650 mg Oral BID  :  No Known Allergies:  History reviewed. No pertinent family history.:  Social History   Social History  . Marital status: Married    Spouse name: N/A  . Number of children: N/A  . Years of education: N/A   Occupational History  . Not on file.   Social History Main Topics  . Smoking status: Never Smoker  . Smokeless tobacco: Never Used  . Alcohol use No  . Drug use: No  . Sexual activity: No   Other Topics Concern  . Not on file   Social History Narrative  . No narrative on file  :  Pertinent items are noted in HPI.  Exam: Patient Vitals for the past 24 hrs:  BP Temp Temp src Pulse Resp SpO2 Height Weight  12/31/16 0414 117/67 98 F (36.7 C) Oral 74 16 100 % - -  12/31/16 0027 (!) 97/58 - - - - - - -  12/30/16 2324 (!) 87/53 97.5 F (36.4 C) Oral - 14 100 % 5\' 4"  (1.626 m) 115 lb 3.2 oz (52.3 kg)  12/30/16 2225 95/61 - - 78 19 100 % - -  12/30/16 2200 95/61 - - 80 13 97 % - -  12/30/16 2130 121/90 98.3 F (36.8 C) Oral - 22 98 % - -  12/30/16 2000 99/72 - - 80 18 100 % - -  12/30/16 1845 108/63 - - 76 21 100 % - -  12/30/16 1600 - - - - - - 5\' 4"  (1.626 m) 140 lb (63.5 kg)  12/30/16 1559 99/66 98.7 F (37.1 C) Oral 75 16 99 % - -   As above    Recent Labs  12/30/16 1355 12/31/16 0522  WBC 3.2* 3.5*  HGB 11.6 11.6*  HCT 36.9 36.9  PLT 138 Large platelets present* 129*    Recent Labs  12/31/16 0208 12/31/16 0522  NA 134* 135  K 5.2* 4.8  CL 107 109  CO2 17* 15*  GLUCOSE 172* 200*  BUN 84* 80*  CREATININE 4.83* 4.81*  CALCIUM 9.3 8.9    Blood smear review:  None  Pathology: None     Assessment and Plan:  Ms. Kamper is a 71 year old  African-American female with metastatic breast cancer. She has extremely poor performance status (ECOG 3-4). She is not a candidate for any chemotherapy.  Again, she just wants to go home.  I told her several times this morning that if she were to go home, then she could easily pass away at home. If so we'll recall EMS, then they would be obligated to put a breathing tube down her and keep her alive on a machine. I don't think this really is in her best interest. Again, she just refuses to acknowledge this.  She understands that she has a cancer that is not treatable. Again taken the Verzenio is a only option that we would have to try to help her. Her CA 27.29 drawn yesterday in the office was further elevated. I think this is a indicator that her disease is progressing.  I probably would keep her here another day. I think we see improvement in her renal function tomorrow, then I would feel better with her try to go home. Maybe I can talk to her a little bit more about what is going on and what the implications are regarding her cancer and trying to give her some extra help.  Again I think if she were not admitted yesterday, that she would not survive at home.  We did have a good prayer session. Hopefully, she will begin to acknowledge and except what is going on. I am try my best to get her to understand that she has a serious problem and which we have very, very few options and helping.  Lattie Haw, MD  Darlyn Chamber 29:11

## 2016-12-31 NOTE — H&P (Signed)
Becky Pena is an 71 y.o. female.   Chief Complaint: Generalized weakness/poor appetite/acute renal failure HPI: Patient is 71 year old female with past medical history significant for hypertension, diabetes mellitus, history of metastatic carcinoma of left breast, chronic kidney disease, severe protein calorie malnutrition, and was transferred to [facility] med center from cancer center as patient was noted to have a generalized weakness poor appetite and acute renal failure with creatinine of 5 and potassium of 6.1. Patient received Kayexalate and had difficulty getting IV access and was transferred to [facility] for further evaluation and management. Patient states she had diarrhea and vomiting approximately 10 days ago after receiving Abemaciclib. Patient presently denies any nausea vomiting or diarrhea. Denies any chest pain. States her appetite has been poor and feels weak and tired. Denies any palpitations.  Past Medical History:  Diagnosis Date  . Breast cancer metastasized to multiple sites, left ([place]) 09/02/2016  . Diabetes mellitus without complication ([place])   . Goals of care, counseling/discussion 12/11/2016    Past Surgical History:  Procedure Laterality Date  . EYE SURGERY     LEFT EYE     No family history on file. Social History:  reports that she has never smoked. She has never used smokeless tobacco. She reports that she does not drink alcohol or use drugs.  Allergies: No Known Allergies  Medications Prior to Admission  Medication Sig Dispense Refill  . folic acid (FOLVITE) 1 MG tablet Take 1 tablet (1 mg total) by mouth daily. 30 tablet 3  . glimepiride (AMARYL) 1 MG tablet Take 1 tablet (1 mg total) by mouth daily with breakfast. 30 tablet 2  . Abemaciclib (VERZENIO) 150 MG TABS Take 150 mg by mouth 2 (two) times daily. 60 tablet 4  . aspirin 81 MG chewable tablet Chew by mouth daily.    . feeding supplement, ENSURE ENLIVE, (ENSURE ENLIVE) LIQD Take 237 mLs  by mouth 2 (two) times daily between meals. 237 mL 12  . GLUCERNA (GLUCERNA) LIQD Take 237 mLs by mouth 3 (three) times daily between meals. 60 Can 3  . mirtazapine (REMERON SOL-TAB) 15 MG disintegrating tablet Take 1 tablet (15 mg total) by mouth at bedtime. 30 tablet 3  . Multiple Vitamin (MULTIVITAMIN WITH MINERALS) TABS tablet Take 1 tablet by mouth daily. 30 tablet 3  . sodium bicarbonate 650 MG tablet Take 1 tablet (650 mg total) by mouth 2 (two) times daily. 60 tablet 3  . UNABLE TO FIND Please draw BMP today and fax results to Dr Marin Olp at [phone] 1 each 0  . UNABLE TO FIND Please dispense bed pads for home use related to patient incontinence. 60 each 3    Results for orders placed or performed during the hospital encounter of 12/30/16 (from the past 48 hour(s))  Basic metabolic panel     Status: Abnormal   Collection Time: 12/30/16  4:45 PM  Result Value Ref Range   Sodium 133 (L) 135 - 145 mmol/L   Potassium 6.1 (H) 3.5 - 5.1 mmol/L   Chloride 107 101 - 111 mmol/L   CO2 18 (L) 22 - 32 mmol/L   Glucose, Bld 97 65 - 99 mg/dL   BUN 87 (H) 6 - 20 mg/dL   Creatinine, Ser 5.00 (H) 0.44 - 1.00 mg/dL   Calcium 9.3 8.9 - 10.3 mg/dL   GFR calc non Af Amer 8 (L) >60 mL/min   GFR calc Af Amer 9 (L) >60 mL/min    Comment: (NOTE) The  eGFR has been calculated using the CKD EPI equation. This calculation has not been validated in all clinical situations. eGFR's persistently <60 mL/min signify possible Chronic Kidney Disease.    Anion gap 8 5 - 15  CBG monitoring, ED     Status: None   Collection Time: 12/30/16  9:37 PM  Result Value Ref Range   Glucose-Capillary 86 65 - 99 mg/dL  CBG monitoring, ED     Status: Abnormal   Collection Time: 12/30/16 10:06 PM  Result Value Ref Range   Glucose-Capillary 117 (H) 65 - 99 mg/dL  Glucose, capillary     Status: Abnormal   Collection Time: 12/31/16 12:24 AM  Result Value Ref Range   Glucose-Capillary 138 (H) 65 - 99 mg/dL   No results  found.  Review of Systems  Constitutional: Positive for malaise/fatigue and weight loss. Negative for chills and fever.  Respiratory: Negative for cough and shortness of breath.   Cardiovascular: Negative for chest pain, palpitations and leg swelling.  Gastrointestinal: Negative for abdominal pain and vomiting.  Neurological: Positive for weakness. Negative for dizziness.    Blood pressure (!) 97/58, pulse 78, temperature 97.5 F (36.4 C), temperature source Oral, resp. rate 14, height _0  (1.626 m), weight 115 lb 3.2 oz (52.3 kg), SpO2 100 %. Physical Exam  Constitutional: She is oriented to person, place, and time.  Eyes: Conjunctivae are normal. Left eye exhibits no discharge. No scleral icterus.  Neck: Normal range of motion. Neck supple. No JVD present. No tracheal deviation present. No thyromegaly present.  Cardiovascular: Normal rate and regular rhythm.   Murmur (Soft systolic murmur noted no S3 gallop) heard. Respiratory: Effort normal and breath sounds normal. No respiratory distress. She has no wheezes. She has no rales.  4 x 4 cm hard mass noted in the left outer breast  GI: Soft. Bowel sounds are normal. She exhibits no distension. There is no tenderness. There is no rebound.  Musculoskeletal: She exhibits no edema, tenderness or deformity.  Neurological: She is alert and oriented to person, place, and time.     Assessment/Plan Acute on chronic renal injury multifactorial Hyperkalemia secondary to above Metastatic left breast carcinoma Dehydration Hypertension controlled by diet Diabetes mellitus Severe protein calorie malnutrition Plan As per orders Oncology consult in a.m. to discuss and manage further care. Check labs in a.m. Charolette Forward, MD 12/31/2016, 1:38 AM

## 2017-01-01 DIAGNOSIS — N390 Urinary tract infection, site not specified: Secondary | ICD-10-CM

## 2017-01-01 LAB — URINALYSIS, ROUTINE W REFLEX MICROSCOPIC
Bilirubin Urine: NEGATIVE
GLUCOSE, UA: NEGATIVE mg/dL
Ketones, ur: NEGATIVE mg/dL
NITRITE: NEGATIVE
PH: 5 (ref 5.0–8.0)
Protein, ur: 100 mg/dL — AB
Specific Gravity, Urine: 1.01 (ref 1.005–1.030)

## 2017-01-01 LAB — COMPREHENSIVE METABOLIC PANEL
ALT: <5 U/L — ABNORMAL LOW (ref 14–54)
AST: 12 U/L — AB (ref 15–41)
Albumin: 2.6 g/dL — ABNORMAL LOW (ref 3.5–5.0)
Alkaline Phosphatase: 70 U/L (ref 38–126)
Anion gap: 8 (ref 5–15)
BILIRUBIN TOTAL: 0.3 mg/dL (ref 0.3–1.2)
BUN: 70 mg/dL — AB (ref 6–20)
CO2: 19 mmol/L — ABNORMAL LOW (ref 22–32)
CREATININE: 3.8 mg/dL — AB (ref 0.44–1.00)
Calcium: 8.4 mg/dL — ABNORMAL LOW (ref 8.9–10.3)
Chloride: 111 mmol/L (ref 101–111)
GFR, EST AFRICAN AMERICAN: 13 mL/min — AB (ref >60)
GFR, EST NON AFRICAN AMERICAN: 11 mL/min — AB (ref >60)
Glucose, Bld: 85 mg/dL (ref 65–99)
POTASSIUM: 3.6 mmol/L (ref 3.5–5.1)
Sodium: 138 mmol/L (ref 135–145)
TOTAL PROTEIN: 6.3 g/dL — AB (ref 6.5–8.1)

## 2017-01-01 LAB — HEMOGLOBIN A1C
Hgb A1c MFr Bld: 6.6 % — ABNORMAL HIGH (ref 4.8–5.6)
Mean Plasma Glucose: 143 mg/dL

## 2017-01-01 LAB — GLUCOSE, CAPILLARY
Glucose-Capillary: 72 mg/dL (ref 65–99)
Glucose-Capillary: 134 mg/dL — ABNORMAL HIGH (ref 65–99)

## 2017-01-01 MED ORDER — ABEMACICLIB 150 MG PO TABS: 150.0000 mg | ORAL_TABLET | Freq: Every day | ORAL | 1 refills | Status: DC

## 2017-01-01 MED ORDER — LEVOFLOXACIN IN D5W 500 MG/100ML IV SOLN
500.0000 mg | Freq: Once | INTRAVENOUS | Status: AC
Start: 2017-01-01 — End: 2017-01-01
  Administered 2017-01-01: 500 mg via INTRAVENOUS
  Filled 2017-01-01: qty 100

## 2017-01-01 MED ORDER — LEVOFLOXACIN 250 MG PO TABS: 250.0000 mg | ORAL_TABLET | ORAL | 0 refills | Status: AC

## 2017-01-01 NOTE — Care Management Note (Signed)
Case Management Note  Patient Details  Name: Becky Pena MRN: [id number] Date of Birth: [birth year]  Subjective/Objective:Noted for ambulance transp home-confirmed address. [facility] forms in shadow chart,& demographic sheet. Nurse will call [facility] when ready. No further CM needs.                    Action/Plan:d/c home.   Expected Discharge Date:  01/01/17               Expected Discharge Plan:  Home/Self Care  In-House Referral:     Discharge planning Services  CM Consult  Post Acute Care Choice:    Choice offered to:     DME Arranged:    DME Agency:     HH Arranged:    HH Agency:     Status of Service:  Completed, signed off  If discussed at [employer] of Stay Meetings, dates discussed:    Additional Comments:  Dessa Phi, RN 01/01/2017, 2:37 PM

## 2017-01-01 NOTE — Care Management Note (Signed)
Case Management Note  Patient Details  Name: Becky Pena MRN: [id number] Date of Birth: 1946-05-06  Subjective/Objective: 71 y/o f admitted w/renal failure. From home.Monitor for d/c needs. If PT cons needed please order.                   Action/Plan:d/c plan home.   Expected Discharge Date:                  Expected Discharge Plan:  Home/Self Care  In-House Referral:     Discharge planning Services  CM Consult  Post Acute Care Choice:    Choice offered to:     DME Arranged:    DME Agency:     HH Arranged:    HH Agency:     Status of Service:  In process, will continue to follow  If discussed at Long Length of Stay Meetings, dates discussed:    Additional Comments:  Dessa Phi, RN 01/01/2017, 11:07 AM

## 2017-01-01 NOTE — Progress Notes (Signed)
Becky Pena has a urinary tract infection. I suspect that this probably is going to be Escherichia coli. She's had this before.  I will go ahead and give her a dose of IV Levaquin. With her renal function, even though it is getting better, we may have to dose reduce.  She really, really, really, really wants to go home today. She understands the potential risk of going home. Thankfully, her creatinine is improving. Her potassium is much better. She feels much more comfortable at home. She knows that with her metastatic breast cancer which we really cannot treat, she prefers to be at home with her mother. I think this would be okay. Again, she understands and accepts the potential risk of her kidneys getting worse, her potassium going higher, and potentially having a fatal cardiac arrhythmia. I talked to her about this this morning. She just wants to go home.  I'm glad that her kidney function is improving. Her urinalysis looks horrible. Again I have to believe this is Escherichia coli since she has had this in the past.  I'm not sure much she is eating. She does look a little bit more alert. She is not hurting.  Her potassium is 3.6 today. Her creatinine is 3.8.  On her physical exam, she is afebrile. It would not surprise and that she cannot mount a fever response because she is in such poor shape. Her temperature is 98.4. Blood pressure 111/56. Pulse is 74. Her lungs sound pretty clear bilaterally. Cardiac exam regular rate and rhythm with a 1/6 murmur. Abdomen is soft. There is no guarding. She has slightly decreased bowel sounds. There is no fluid wave. Extremities shows most likely in upper and lower extremities. Neurological exam shows no focal neurological deficits.  Becky Pena has metastatic triple negative breast cancer. We have no therapeutic options for her outside of the Verzenio. I told her to take 1 pill a day instead of 2 pills a day. Again this may help. This is clearly our only option  for her. She is not a candidate for chemotherapy. She understands this. I'm still not sure how much she accepts the fact that she has an incurable disease with very limited treatment options.  Please, please just let her go home. I would think that she should be okay on oral antibiotics. I will put her on oral Levaquin. I will give her a dose of IV Levaquin in the hospital today. Hopefully she will take the oral antibiotic.  She is really looking forward to being able to go home today.  As always, the staff on [address] has done a tremendous job in taking care of her.  Lattie Haw, MD  Darlyn Chamber 17:14

## 2017-01-01 NOTE — Discharge Summary (Signed)
Discharge summary dictated on 01/01/2017

## 2017-01-01 NOTE — Progress Notes (Signed)
Orders for Discharge written. Patient education completed. Asked if patient had help at home and patient stated "yes, my mom." Offered patient resources for rehab. Patient declined stating "No, I ain't going to rehab, I'm going home."

## 2017-01-01 NOTE — Discharge Instructions (Signed)
Acute Kidney Injury, Adult Acute kidney injury (AKI) occurs when there is sudden (acute) damage to the kidneys.A small amount of kidney damage may not cause problems, but a large amount of damage may make it difficult or impossible for the kidneys to work the way they should. AKI may develop into long-lasting (chronic) kidney disease. Early detection and treatment of AKI may prevent kidney damage from becoming permanent or getting worse. What are the causes? Common causes of this condition include:  A problem with blood flow to the kidneys. This may be caused by:  Blood loss.  Heart and blood vessel (cardiovascular) disease.  Severe burns.  Liver disease.  Direct damage to the kidneys. This may be caused by:  Some medicines.  A kidney infection.  Poisoning.  Being around or in contact with poisonous (toxic) substances.  A surgical wound.  A hard, direct force to the kidney area.  A sudden blockage of urine flow. This may be caused by:  Cancer.  Kidney stones.  Enlarged prostate in males. What are the signs or symptoms? Symptoms develop slowly and may not be obvious until the kidney damage becomes severe. It is possible to have AKI for years without showing any symptoms. Symptoms of this condition can include:  Swelling (edema) of the face, legs, ankles, or feet.  Numbness, tingling, or loss of feeling (sensation) in the hands or feet.  Tiredness (lethargy).  Nausea or vomiting.  Confusion or trouble concentrating.  Problems with urination, such as:  Painful or burning feeling during urination.  Decreased urine production.  Frequent urination, especially at night.  Bloody urine.  Muscle twitches and cramps, especially in the legs.  Shortness of breath.  Weakness.  Constant itchiness.  Loss of appetite.  Metallic taste in the mouth.  Trouble sleeping.  Pale lining of the eyelids and surface of the eye (conjunctiva). How is this diagnosed? This  condition may be diagnosed with various tests. Tests may include:  Blood tests.  Urine tests.  Imaging tests.  A test in which a sample of tissue is removed from the kidneys to be looked at under a microscope (kidney biopsy). How is this treated? Treatment of AKIvaries depending on the cause and severity of the kidney damage. In mild cases, treatment may not be needed. The kidneys may heal on their own. If AKI is more severe, your health care provider will treat the cause of the kidney damage, help the kidneys heal, and prevent problems from occurring. Severe cases may require a procedure to remove toxic wastes from the body (dialysis) or surgery to repair kidney damage. Surgery may involve:  Repair of a torn kidney.  Removal of a urine flow obstruction. Follow these instructions at home:  Follow your prescribed diet.  Take over-the-counter and prescription medicines only as told by your health care provider.  Do not take any new medicines unless approved by your health care provider. Many medicines can worsen your kidney damage.  Do not take any vitamin and mineral supplements unless approved by your health care provider. Many nutritional supplements can worsen your kidney damage.  The dose of some medicines that you take may need to be adjusted.  Do not use any tobacco products, such as cigarettes, chewing tobacco, and e-cigarettes. If you need help quitting, ask your health care provider.  Keep all follow-up visits as told by your health care provider. This is important.  Keep track of your blood pressure. Report changes in your blood pressure as told by your  health care provider.  Achieve and maintain a healthy weight. If you need help with this, ask your health care provider.  Start or continue an exercise plan. Try to exercise at least 30 minutes a day, 5 days a week.  Stay current with immunizations as told by your health care provider. Where to find more  information:  American Association of Kidney Patients: BombTimer.gl  National Kidney Foundation: www.kidney.[place]: https://mathis.com/  Life Options [facility]: www.lifeoptions.org and www.kidneyschool.org Contact a health care provider if:  Your symptoms get worse.  You develop new symptoms. Get help right away if:  You develop symptoms of end-stage kidney disease, which include:  Headaches.  Abnormally dark or light skin.  Numbness in the hands or feet.  Easy bruising.  Frequent hiccups.  Chest pain.  Shortness of breath.  End of menstruation in women.  You have a fever.  You have decreased urine production.  You have pain or bleeding when you urinate. This information is not intended to replace advice given to you by your health care provider. Make sure you discuss any questions you have with your health care provider. Document Released: 06/01/2011 Document Revised: 06/25/2016 Document Reviewed: 07/15/2012 Elsevier Interactive Patient Education  2017 [place].  Urinary Tract Infection, Adult A urinary tract infection (UTI) is an infection of any part of the urinary tract, which includes the kidneys, ureters, bladder, and urethra. These organs make, store, and get rid of urine in the body. UTI can be a bladder infection (cystitis) or kidney infection (pyelonephritis). What are the causes? This infection may be caused by fungi, viruses, or bacteria. Bacteria are the most common cause of UTIs. This condition can also be caused by repeated incomplete emptying of the bladder during urination. What increases the risk? This condition is more likely to develop if:  You ignore your need to urinate or hold urine for long periods of time.  You do not empty your bladder completely during urination.  You wipe back to front after urinating or having a bowel movement, if you are female.  You are uncircumcised, if you are female.  You are  constipated.  You have a urinary catheter that stays in place (indwelling).  You have a weak defense (immune) system.  You have a medical condition that affects your bowels, kidneys, or bladder.  You have diabetes.  You take antibiotic medicines frequently or for long periods of time, and the antibiotics no longer work well against certain types of infections (antibiotic resistance).  You take medicines that irritate your urinary tract.  You are exposed to chemicals that irritate your urinary tract.  You are female. What are the signs or symptoms? Symptoms of this condition include:  Fever.  Frequent urination or passing small amounts of urine frequently.  Needing to urinate urgently.  Pain or burning with urination.  Urine that smells bad or unusual.  Cloudy urine.  Pain in the lower abdomen or back.  Trouble urinating.  Blood in the urine.  Vomiting or being less hungry than normal.  Diarrhea or abdominal pain.  Vaginal discharge, if you are female. How is this diagnosed? This condition is diagnosed with a medical history and physical exam. You will also need to provide a urine sample to test your urine. Other tests may be done, including:  Blood tests.  Sexually transmitted disease (STD) testing. If you have had more than one UTI, a cystoscopy or imaging studies may be done to determine the cause of  the infections. How is this treated? Treatment for this condition often includes a combination of two or more of the following:  Antibiotic medicine.  Other medicines to treat less common causes of UTI.  Over-the-counter medicines to treat pain.  Drinking enough water to stay hydrated. Follow these instructions at home:  Take over-the-counter and prescription medicines only as told by your health care provider.  If you were prescribed an antibiotic, take it as told by your health care provider. Do not stop taking the antibiotic even if you start to feel  better.  Avoid alcohol, caffeine, tea, and carbonated beverages. They can irritate your bladder.  Drink enough fluid to keep your urine clear or pale yellow.  Keep all follow-up visits as told by your health care provider. This is important.  Make sure to:  Empty your bladder often and completely. Do not hold urine for long periods of time.  Empty your bladder before and after sex.  Wipe from front to back after a bowel movement if you are female. Use each tissue one time when you wipe. Contact a health care provider if:  You have back pain.  You have a fever.  You feel nauseous or vomit.  Your symptoms do not get better after 3 days.  Your symptoms go away and then return. Get help right away if:  You have severe back pain or lower abdominal pain.  You are vomiting and cannot keep down any medicines or water. This information is not intended to replace advice given to you by your health care provider. Make sure you discuss any questions you have with your health care provider. Document Released: 08/26/2005 Document Revised: 04/29/2016 Document Reviewed: 10/07/2015 Elsevier Interactive Patient Education  2017 [employer].

## 2017-01-02 NOTE — Discharge Summary (Signed)
[name], Becky Pena               ACCOUNT NO.:  [id number]  MEDICAL RECORD NO.:  [id number]  LOCATION:  WA07                         FACILITY:  [facility]  PHYSICIAN:  Allegra Lai. Terrence Dupont, M.D. DATE OF BIRTH:  1946/10/25  DATE OF ADMISSION:  12/31/2016 DATE OF DISCHARGE:  01/01/2017                              DISCHARGE SUMMARY   ADMITTING DIAGNOSES: 1. Acute on chronic renal injury, multifactorial. 2. Hyperkalemia secondary to above. 3. Metastatic left breast carcinoma. 4. Dehydration. 5. Hypertension, controlled by diet. 6. Diabetes mellitus. 7. Severe protein calorie malnutrition.  DISCHARGE DIAGNOSES: 1. Resolving urinary tract infection. 2. Resolving acute on chronic renal injury. 3. Status post hyperkalemia. 4. Metastatic left breast carcinoma. 5. Resolving dehydration. 6. Hypertension, controlled by diet. 7. Diabetes mellitus. 8. Severe protein-calorie malnutrition.  DISCHARGE HOME MEDICATIONS: 1. Levaquin 250 mg, 1 tablet every other day.  Total 5 tablets. 2. Continue rest of the home medications i.e. Verzenio 150 mg daily. 3. Aspirin 81 mg daily. 4. Ensure supplement twice daily as before. 5. Glucerna 3 times daily as before as tolerated. 6. Folic acid 1 mg daily. 7. Remeron 15 mg daily at bedtime. 8. Multivitamin with mineral, 1 tablet daily. 9. Rolaids 1-2 tablets every 4 hours as needed. 10.Sodium bicarbonate 1 tablet twice daily. 11.The patient has been advised to stop glimepiride for now.  DIET:  Low-salt, low-cholesterol, 1800 calories diet as tolerated.  CONDITION AT DISCHARGE:  Stable.  The patient is eager to go home and wants to leave home and take care of her mom.  BRIEF HISTORY AND HOSPITAL COURSE:  [name]   Dictation ended at this point.     Allegra Lai. Terrence Dupont, M.D.     MNH/MEDQ  D:  01/01/2017  T:  01/02/2017  Job:  [id number]

## 2017-01-03 LAB — URINE CULTURE

## 2017-01-19 ENCOUNTER — Other Ambulatory Visit: Payer: Self-pay | Admitting: Hematology & Oncology

## 2017-01-25 ENCOUNTER — Other Ambulatory Visit (HOSPITAL_BASED_OUTPATIENT_CLINIC_OR_DEPARTMENT_OTHER): Payer: Medicare Other

## 2017-01-25 ENCOUNTER — Telehealth: Payer: Self-pay | Admitting: *Deleted

## 2017-01-25 ENCOUNTER — Ambulatory Visit (HOSPITAL_BASED_OUTPATIENT_CLINIC_OR_DEPARTMENT_OTHER): Payer: Medicare Other | Admitting: Hematology & Oncology

## 2017-01-25 VITALS — BP 92/59 | HR 81 | Temp 97.9°F | Resp 16

## 2017-01-25 DIAGNOSIS — C50912 Malignant neoplasm of unspecified site of left female breast: Secondary | ICD-10-CM

## 2017-01-25 DIAGNOSIS — Z171 Estrogen receptor negative status [ER-]: Secondary | ICD-10-CM

## 2017-01-25 DIAGNOSIS — N63 Unspecified lump in unspecified breast: Secondary | ICD-10-CM

## 2017-01-25 LAB — CMP (CANCER CENTER ONLY)
ALBUMIN: 3.1 g/dL — AB (ref 3.3–5.5)
ALK PHOS: 100 U/L — AB (ref 26–84)
ALT: 17 U/L (ref 10–47)
AST: 23 U/L (ref 11–38)
BILIRUBIN TOTAL: 0.5 mg/dL (ref 0.20–1.60)
BUN: 50 mg/dL — AB (ref 7–22)
CO2: 20 mEq/L (ref 18–33)
CREATININE: 3.3 mg/dL — AB (ref 0.6–1.2)
Calcium: 10 mg/dL (ref 8.0–10.3)
Chloride: 106 mEq/L (ref 98–108)
Glucose, Bld: 79 mg/dL (ref 73–118)
Potassium: 4.7 mEq/L (ref 3.3–4.7)
SODIUM: 139 meq/L (ref 128–145)
TOTAL PROTEIN: 7 g/dL (ref 6.4–8.1)

## 2017-01-25 LAB — CBC WITH DIFFERENTIAL (CANCER CENTER ONLY)
BASO#: 0 10*3/uL (ref 0.0–0.2)
BASO%: 0.9 % (ref 0.0–2.0)
EOS%: 1.2 % (ref 0.0–7.0)
Eosinophils Absolute: 0 10*3/uL (ref 0.0–0.5)
HCT: 31.2 % — ABNORMAL LOW (ref 34.8–46.6)
HEMOGLOBIN: 10 g/dL — AB (ref 11.6–15.9)
LYMPH#: 1.8 10*3/uL (ref 0.9–3.3)
LYMPH%: 55.6 % — AB (ref 14.0–48.0)
MCH: 27.7 pg (ref 26.0–34.0)
MCHC: 32.1 g/dL (ref 32.0–36.0)
MCV: 86 fL (ref 81–101)
MONO#: 0.2 10*3/uL (ref 0.1–0.9)
MONO%: 6.4 % (ref 0.0–13.0)
NEUT%: 35.9 % — ABNORMAL LOW (ref 39.6–80.0)
NEUTROS ABS: 1.2 10*3/uL — AB (ref 1.5–6.5)
PLATELETS: 260 10*3/uL (ref 145–400)
RBC: 3.61 10*6/uL — AB (ref 3.70–5.32)
RDW: 17.5 % — ABNORMAL HIGH (ref 11.1–15.7)
WBC: 3.3 10*3/uL — AB (ref 3.9–10.0)

## 2017-01-25 NOTE — Telephone Encounter (Signed)
Critical Value Creatinine 3.3 Dr Marin Olp notified No orders received

## 2017-01-25 NOTE — Progress Notes (Signed)
Hematology and Oncology Follow Up Visit  Becky Pena [id number] [birth year] 71 y.o. 01/25/2017   Principle Diagnosis:   Metastatic breast cancer- ER -/Her2 (-) -- CDK6/PIK3CA (+)  Current Therapy:    Abemaciclib 168m po q day     Interim History:  Ms. ACrutcheris back for follow-up. She is doing better. She is still at home. She is living with her mother who is 861years old.  She was hospitalized we last saw her. She was hospitalized because her potassium was 5.8 and her creatinine was 5.1. I felt that if we didn't admit her, that she could've easily passed on because of cardiac failure or rest from the hyperkalemia.  She is supposed to be taken the Versenio daily. I think she is doing this. There is a new prescription that we gave her. Hopefully she will be able to get this filled.  She says that she is eating.. She's not having diarrhea. She is having some stiffness in her fingers.  She is not having any fever. She's having no bleeding.   Overall, her performance status is ECOG 3.   Medications:  Current Outpatient Prescriptions:  .  Abemaciclib (VERZENIO) 150 MG TABS, Take 150 mg by mouth daily., Disp: 30 tablet, Rfl: 1 .  aspirin 81 MG chewable tablet, Chew by mouth daily., Disp: , Rfl:  .  Ca Carbonate-Mag Hydroxide (ROLAIDS PO), Take 1-2 tablets by mouth every 4 (four) hours as needed (indigestion)., Disp: , Rfl:  .  feeding supplement, ENSURE ENLIVE, (ENSURE ENLIVE) LIQD, Take 237 mLs by mouth 2 (two) times daily between meals. (Patient not taking: Reported on 12/31/2016), Disp: 237 mL, Rfl: 12 .  folic acid (FOLVITE) 1 MG tablet, Take 1 tablet (1 mg total) by mouth daily., Disp: 30 tablet, Rfl: 3 .  glimepiride (AMARYL) 1 MG tablet, TAKE 1 TABLET BY MOUTH EVERY DAY WITH BREAKFAST, Disp: 30 tablet, Rfl: 2 .  GLUCERNA (GLUCERNA) LIQD, Take 237 mLs by mouth 3 (three) times daily between meals., Disp: 60 Can, Rfl: 3 .  mirtazapine (REMERON SOL-TAB) 15 MG disintegrating tablet,  Take 1 tablet (15 mg total) by mouth at bedtime., Disp: 30 tablet, Rfl: 3 .  Multiple Vitamin (MULTIVITAMIN WITH MINERALS) TABS tablet, Take 1 tablet by mouth daily., Disp: 30 tablet, Rfl: 3 .  sodium bicarbonate 650 MG tablet, Take 1 tablet (650 mg total) by mouth 2 (two) times daily., Disp: 60 tablet, Rfl: 3  Allergies: No Known Allergies  Past Medical History, Surgical history, Social history, and Family History were reviewed and updated.  Review of Systems: As above   Physical Exam:  oral temperature is 97.9 F (36.6 C). Her blood pressure is 92/59 (abnormal) and her pulse is 81. Her respiration is 16 and oxygen saturation is 100%.   Wt Readings from Last 3 Encounters:  12/30/16 115 lb 3.2 oz (52.3 kg)  09/16/16 140 lb (63.5 kg)  09/04/16 140 lb (63.5 kg)     Chronically ill appearing, petite, African-American female in no obvious distress. Head and neck exam shows no ocular or oral lesions. She has ptosis of the left eye. I think she may have a traumatic cataract of the left eye. She has had no adenopathy in the neck. Lungs are relatively clear bilaterally. Cardiac exam regular rate and rhythm with a normal S1 and S2. There are no murmurs, rubs or bruits. Abdomen is soft. She has good bowel sounds. There is no fluid wave. There is no palpable liver or spleen tip.  Breast exam shows large mass in the outer portion of the left breast. I cannot palpate any obvious left axillary adenopathy. Right breast is unremarkable. Extremities shows no clubbing, cyanosis or edema. She has some mild atrophy of her extremities. Neurological exam shows no focal neurological deficits. Skin exam shows no rashes, ecchymosis or petechia.   Lab Results  Component Value Date   WBC 3.3 (L) 01/25/2017   HGB 10.0 (L) 01/25/2017   HCT 31.2 (L) 01/25/2017   MCV 86 01/25/2017   PLT 260 01/25/2017     Chemistry      Component Value Date/Time   NA 139 01/25/2017 0956   NA 136 10/23/2016 1129   K 4.7  01/25/2017 0956   K 5.3 (H) 10/23/2016 1129   CL 106 01/25/2017 0956   CO2 20 01/25/2017 0956   CO2 14 (L) 10/23/2016 1129   BUN 50 (H) 01/25/2017 0956   BUN 64.3 (H) 10/23/2016 1129   CREATININE 3.3 (HH) 01/25/2017 0956   CREATININE 2.4 (H) 10/23/2016 1129      Component Value Date/Time   CALCIUM 10.0 01/25/2017 0956   CALCIUM 10.2 10/23/2016 1129   ALKPHOS 100 (H) 01/25/2017 0956   ALKPHOS 218 (H) 10/23/2016 1129   AST 23 01/25/2017 0956   AST 11 10/23/2016 1129   ALT 17 01/25/2017 0956   ALT <6 10/23/2016 1129   BILITOT 0.50 01/25/2017 0956   BILITOT 0.31 10/23/2016 1129         Impression and Plan: Becky Pena is A 71 year old postmenopausal Afro-American female. She has a large left breast mass. She had a left axillary lymph node biopsied. She cannot have the breast mass itself biopsied because radiology would not do it at the hospital and says she would have to go to the breast center.  Again, she seems to be doing a little bit better. This is a "victory". She seems fairly comfortable. I'm not sure of how she is able to move around. Not sure, she really is eating area did  It is hard to really say how this breast masses doing. By clinical exam, it does not appear to be any larger. We will have see what her CA 27.29 is.   We'll plan to get her back in another 3 weeks. Hopefully, she will hold her own area and I just want to give her quality of life. I talked to her about end-of-life issues. She just is not able to make a decision as to whether or not she wants comfort care or aggressive intervention. As such, we have to continue to be aggressive.   Volanda Napoleon, MD 2/26/201810:46 AM

## 2017-01-26 LAB — CANCER ANTIGEN 27.29: CAN 27.29: 509.9 U/mL — AB (ref 0.0–38.6)

## 2017-02-17 ENCOUNTER — Other Ambulatory Visit: Payer: Medicare Other

## 2017-02-17 ENCOUNTER — Ambulatory Visit: Payer: Medicare Other | Admitting: Hematology & Oncology

## 2017-03-05 ENCOUNTER — Encounter: Payer: Self-pay | Admitting: *Deleted

## 2017-03-05 ENCOUNTER — Telehealth: Payer: Self-pay | Admitting: *Deleted

## 2017-03-05 ENCOUNTER — Other Ambulatory Visit: Payer: Medicare Other

## 2017-03-05 ENCOUNTER — Ambulatory Visit: Payer: Medicare Other | Admitting: Hematology & Oncology

## 2017-03-05 ENCOUNTER — Telehealth: Payer: Self-pay | Admitting: Hematology & Oncology

## 2017-03-05 NOTE — Telephone Encounter (Signed)
Patient called and cx 03/05/17 apt and resch for 04/05/17

## 2017-03-05 NOTE — Telephone Encounter (Signed)
Caregiver diane calling. States patient's insurance will no longer pay for transportation to dr. Gabriel Carina appts. Gave diane name and # of social worker lauren mullis at [facility] for assistance.

## 2017-03-08 ENCOUNTER — Other Ambulatory Visit: Payer: Self-pay | Admitting: *Deleted

## 2017-03-08 DIAGNOSIS — C50912 Malignant neoplasm of unspecified site of left female breast: Secondary | ICD-10-CM

## 2017-03-08 MED ORDER — ABEMACICLIB 150 MG PO TABS: 150.0000 mg | ORAL_TABLET | Freq: Every day | ORAL | 1 refills | Status: DC

## 2017-03-10 ENCOUNTER — Other Ambulatory Visit: Payer: Self-pay | Admitting: *Deleted

## 2017-03-10 ENCOUNTER — Encounter: Payer: Self-pay | Admitting: *Deleted

## 2017-03-10 DIAGNOSIS — C50912 Malignant neoplasm of unspecified site of left female breast: Secondary | ICD-10-CM

## 2017-03-10 MED ORDER — ABEMACICLIB 150 MG PO TABS
150.0000 mg | ORAL_TABLET | Freq: Every day | ORAL | 1 refills | Status: AC
Start: 2017-03-10 — End: ?

## 2017-03-10 NOTE — Progress Notes (Signed)
[facility] Clinical Social Worker  Clinical Social Worker received referral for transportation needs.  CSW contacted patient at home to offer support and assess for needs.  Patients stated she had been using ambulance transport to her appointments, but was told "she could no longer use them".  Patient reported that she was unable to walk or sit in a wheel chair, and does not have a wheel chair in her home.  Patient stated she "stays in her bed" and is unable to get out of her home with out assistance.  CSW and patient explored other means of support; including family and friends.  Patient was unable to identify additional support or person CSW could contact to discuss transportation needs.  CSW team will continue to explore available resources and options.     Johnnye Lana, MSW, LCSW, OSW-C Clinical Social Worker [facility] [phone]

## 2017-03-10 NOTE — Progress Notes (Signed)
[place] Social Work  Clinical Social Work contacted Becky Homes, RN to problem solve case and discuss transportation issues. Pt had been using some sort of ambulance transportation as she had felt she could not walk currently. CSW wondered if [facility] services for PT consult may be helpful to pt. Per RN, pt had [place] [facility] in the past but fired them. Pt could benefit from continued [place] discussion, possible HH referral for PT and DME for wheelchair. If pt had wheelchair, she could possibly use a less expensive wheelchair van, but will still have to pay out of pocket. CSW has no other transportation resources to offer as pt would have to pay out of pocket for ambulance transportation. CSW team available as other needs arise.    Clinical Social Work interventions:  Resource assistance  Becky Pena, [employer], OSW-C Clinical Social Worker [facility]  [place] Phone: [phone] Fax: [phone]

## 2017-03-30 ENCOUNTER — Other Ambulatory Visit: Payer: Self-pay | Admitting: *Deleted

## 2017-03-30 MED ORDER — GLIMEPIRIDE 1 MG PO TABS: ORAL_TABLET | ORAL | 2 refills | Status: AC

## 2017-03-31 ENCOUNTER — Telehealth: Payer: Self-pay | Admitting: *Deleted

## 2017-03-31 NOTE — Telephone Encounter (Signed)
Herbert Deaner notifying the office that she is concerned about patient. She states patient has altered mental status. She "confused and talking out of her head". Diane also has significant concerns about the living situation. She states patient cannot participate in any ADLs or physical activity and is being cared for by an 69yo mother. She states the patient looks like her "sister did before she died" and she thinks something significant is going on. She has tried to get the patient to the hospital, but patient refuses. The elderly mother refuses to make the patient do anything she doesn't want to. This has created conflict as the mother has called EMS for transport/hospital before but the patient refused to go and the mother allowed her to stay. Diane feels like the situation is unsafe for the patient and the elderly mother. She wants the patient to be taken to the hospital.   Actively listened to Diane's concerns. Explained that from the office, we could not force patient to go to the hospital. It was suggested to [place], to go to the home and speak to the mother and have her understand that the patient was unsafe, and needed to be assessed at the hospital, regardless of patient's opinion. From the description from [place], it sounds like the patient is extremely confused and would lack the ability to make decisions. In this case, the mother would need to be firm with her concerns and share those with EMS. Diane stated she would try to go to the house and have the mother understand the situation and call EMS for transport to the ED for assessment of the patient's mental state.   If admitted, she is interested in having patient for facility admission after discharge, or home health with equipment. Of note, patient has had home health in the home prior, and discharged them herself refusing to accept care.

## 2017-04-05 ENCOUNTER — Ambulatory Visit: Payer: Medicare Other | Admitting: Hematology & Oncology

## 2017-04-05 ENCOUNTER — Other Ambulatory Visit: Payer: Medicare Other

## 2017-04-07 ENCOUNTER — Telehealth: Payer: Self-pay | Admitting: *Deleted

## 2017-04-07 NOTE — Telephone Encounter (Signed)
Received call from [facility] from hospice saying they got a call from patients pastors wife indicating that patient is now receptive to having Hospice services.  Dr. Marin Olp in agreement with this and orders given for Hospice Referral.

## 2017-05-04 ENCOUNTER — Telehealth: Payer: Self-pay | Admitting: *Deleted

## 2017-05-04 NOTE — Telephone Encounter (Signed)
Received notification from [facility] that patient passed away on 05/14/17 at [place]  Dr Marin Olp notified.

## 2017-05-20 IMAGING — CT CT CHEST W/O CM
2 of 4 series · 13 of 36 positions shown, 16 images · non-contrast
Comparison: 08/23/2016

CLINICAL DATA: Left-sided breast cancer. Off therapy. Evaluate for
progression. Diabetes. Elevated creatinine.

EXAM:
CT CHEST, ABDOMEN AND PELVIS WITHOUT CONTRAST
TECHNIQUE: Multidetector CT imaging of the chest, abdomen and pelvis was
performed following the standard protocol without IV contrast.

[Series 2: axial st · axial · 0.90mm/px · z∈[-501,-6]mm · 10 of 118 slices shown, 13 images]
[im 10/118  mediastinal]
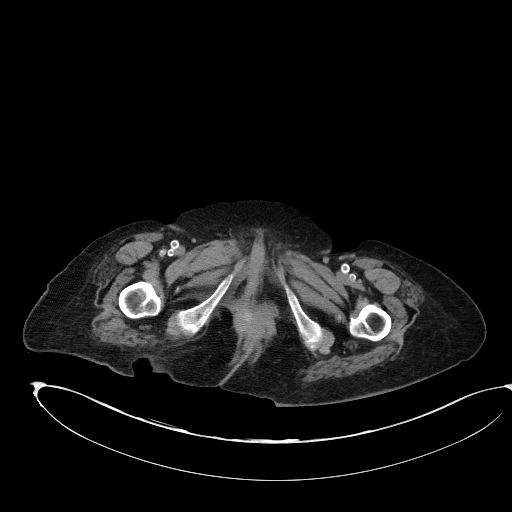
[im 10/118  lung]
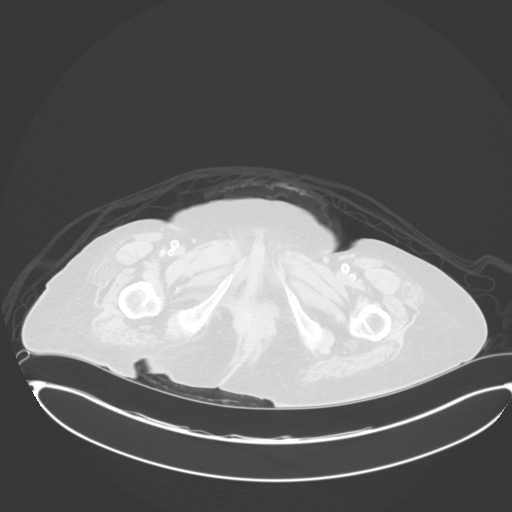
[im 19/118  lung]
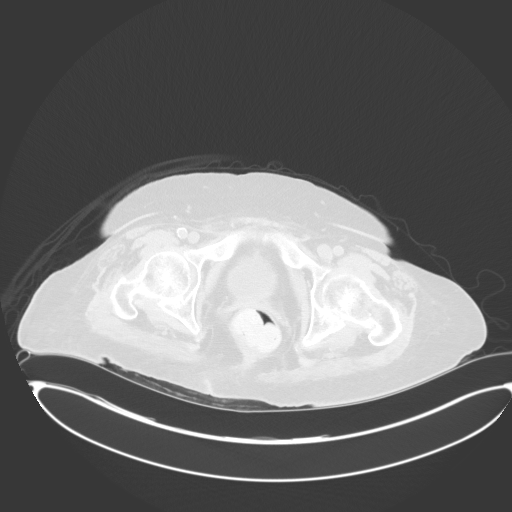
[im 37/118  lung]
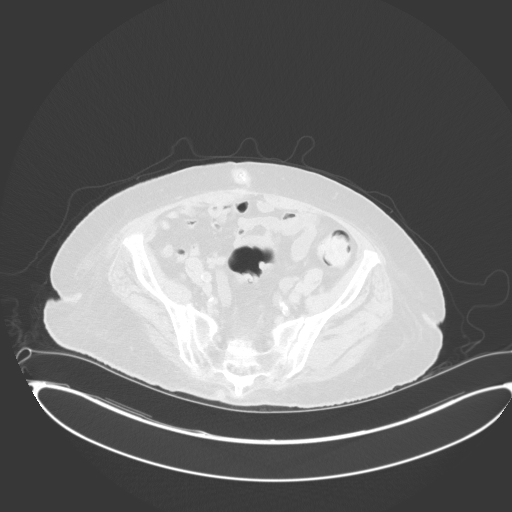
[im 46/118  lung]
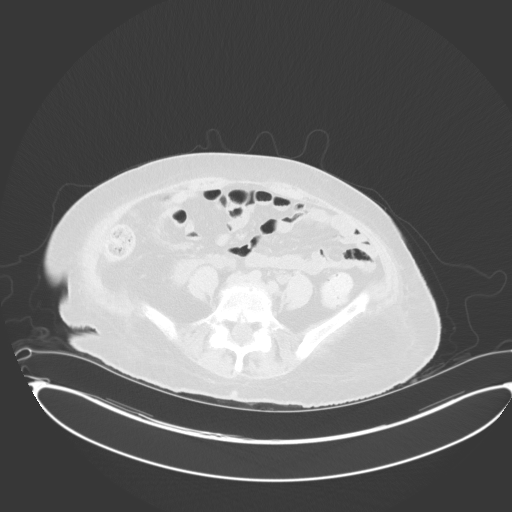
[im 55/118  mediastinal]
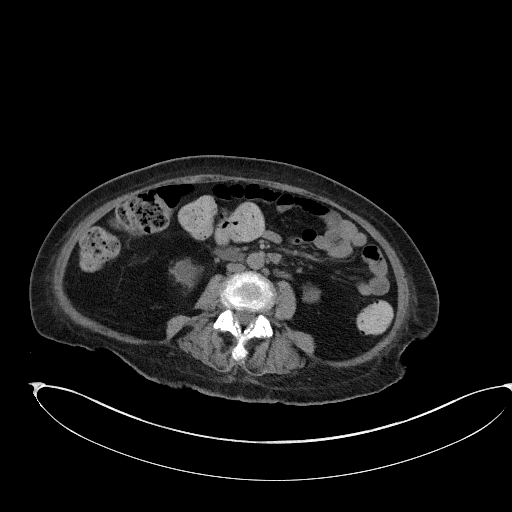
[im 55/118  lung]
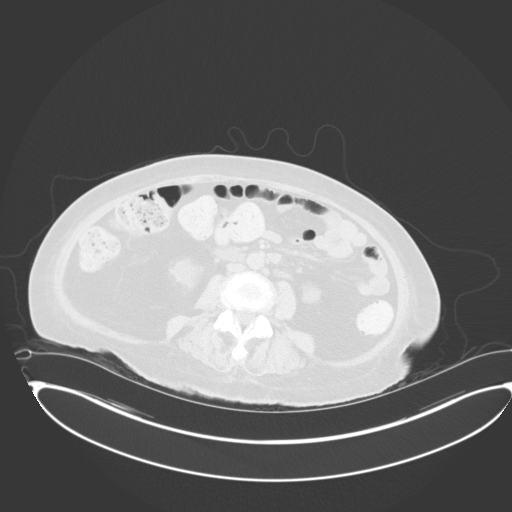
[im 64/118  lung]
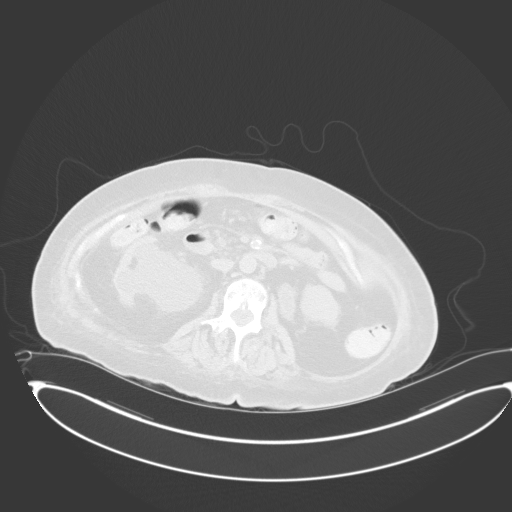
[im 73/118  lung]
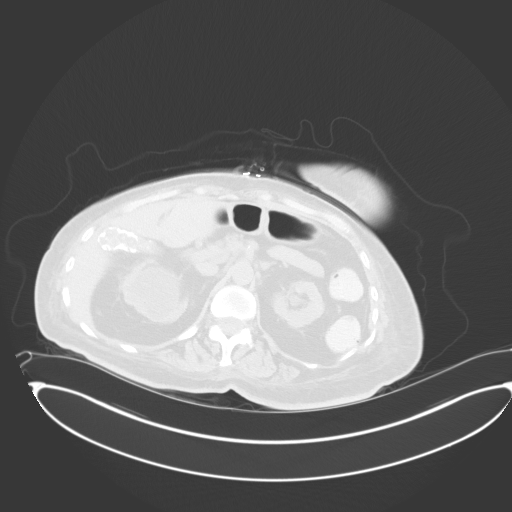
[im 91/118  lung]
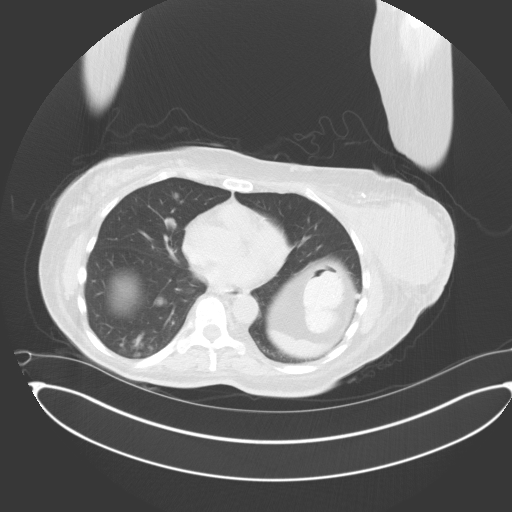
[im 100/118  mediastinal]
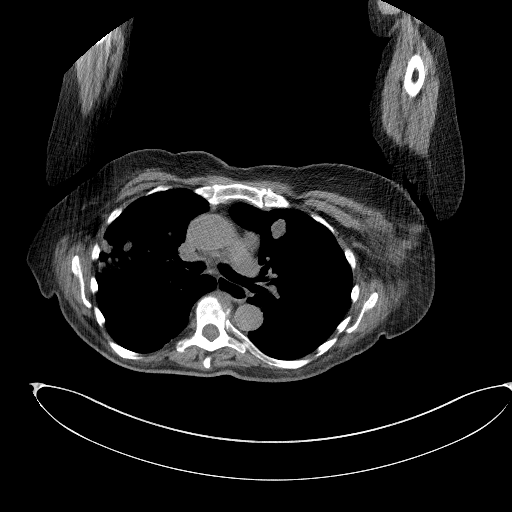
[im 100/118  lung]
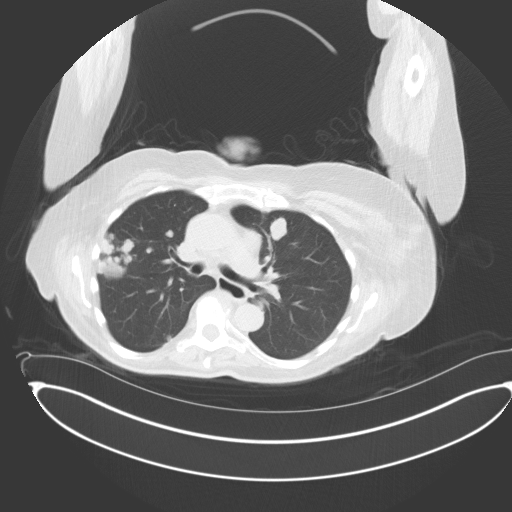
[im 109/118  lung]
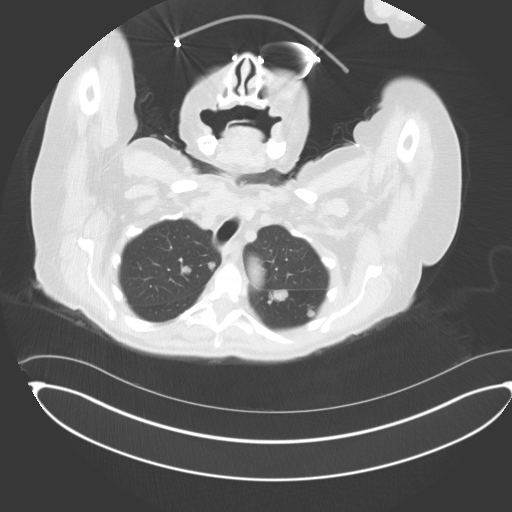

[Series 5: coronal · coronal · 0.88mm/px · 3 of 106 slices shown]
[im 22/106  lung]
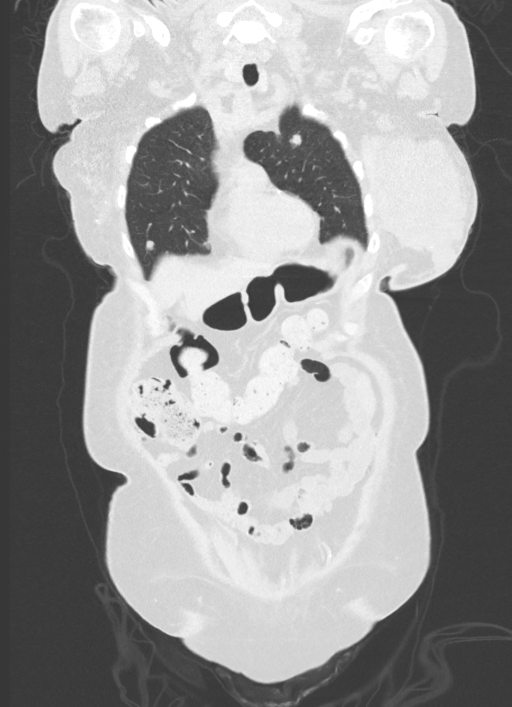
[im 43/106  lung]
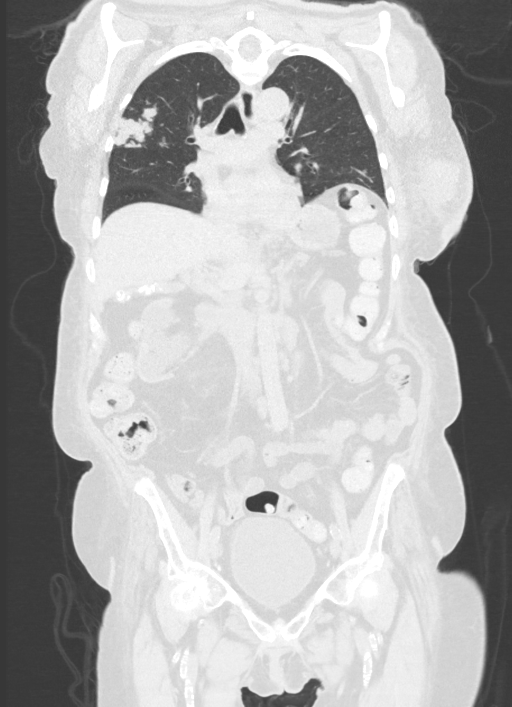
[im 64/106  lung]
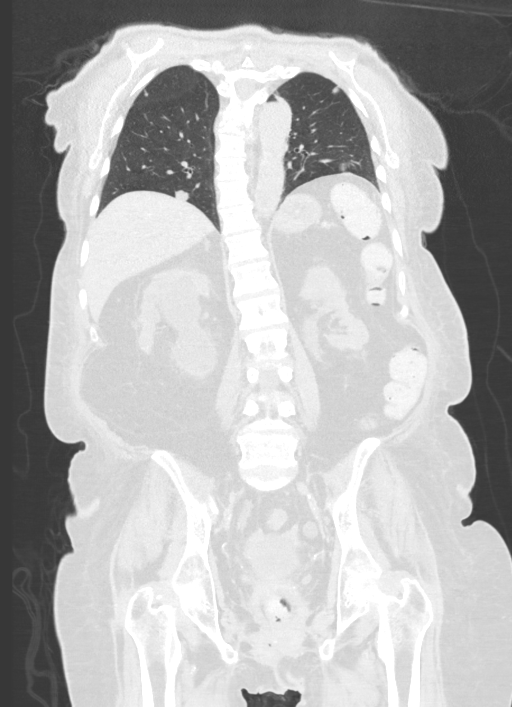

[13 of 36 positions shown; findings below may reference images not displayed]

FINDINGS: CT CHEST FINDINGS

Cardiovascular: Tortuous thoracic aorta. Normal heart size, without
pericardial effusion.

Mediastinum/Nodes: Hypoattenuating right thyroid nodule is of
doubtful clinical significance given comorbidities. Hilar regions
poorly evaluated without intravenous contrast. Esophageal fluid
level in image 22/series 2.

Prevascular node measures 1.4 cm on image 19/series 2. Compare 8 mm
on the prior. No internal mammary adenopathy.

Lungs/Pleura: No pleural fluid. Extensive bilateral pulmonary
metastasis. Index left lower lobe 2.8 x 2.6 cm nodule on image
58/series 4 is enlarged from 1.7 x 1.9 on the prior exam (when
remeasured).

A superior segment right lower lobe index nodule measures 1.9 cm on
image 36/series 4 versus 1.2 cm on the prior exam (when remeasured).
No lobar consolidation.

Musculoskeletal: Lateral left breast primary measures on the order
of 7.1 x 6.3 cm today versus 7.1 x 5.9 cm previously. Deep left
axillary node measures 2.2 cm on image 16/ series 2 today versus
cm on the prior exam (when remeasured). No right axillary
adenopathy. Multifocal osseous metastasis again identified. Interval
T6 moderate compression deformity secondary to dominant osseous
metastasis.

CT ABDOMEN PELVIS FINDINGS

Hepatobiliary: Normal noncontrast appearance of the liver.
Gallbladder wall calcification with underlying gallstones. No acute
inflammation or biliary duct dilatation.

Pancreas: Pancreatic atrophy, without duct dilatation or dominant
mass.

Spleen: Normal in size, without focal abnormality.

Adrenals/Urinary Tract: Normal adrenal glands. Improvement in right
perinephric and periureteric edema. Persistent moderate to marked
right-sided hydroureteronephrosis with overlying cortical thinning.
Right hydroureter gradually decreases within the midpelvis.

Moderate left renal cortical thinning. Mild left-sided
hydroureteronephrosis is similar. Left hydroureter is followed to
the level of the bladder, without cause identified.

Improved pericystic edema.

Stomach/Bowel: Normal stomach, without wall thickening. Colonic
stool burden suggests constipation. Normal small bowel.

Vascular/Lymphatic: Aortic and branch vessel atherosclerosis. No
abdominopelvic adenopathy.

Reproductive: Hysterectomy.  No adnexal mass.

Other: No significant free fluid. Pelvic floor laxity. No evidence
of omental or peritoneal disease.

Musculoskeletal: Osteopenia. Extensive osseous metastasis. The lytic
left iliac lesion measures maximally 3.4 cm on image 78/series 2.
Compare 3.0 cm on the prior exam (when remeasured). Increased
surrounding soft tissue component.
IMPRESSION: CT CHEST IMPRESSION

1. Slight increase in left breast primary and left axillary node
metastasis.
2. Progression of pulmonary metastasis.
3. New or progressive anterior mediastinal nodal metastasis.
4. Persistent osseous metastasis with new T6 compression fracture.
5. Esophageal air fluid level suggests dysmotility or
gastroesophageal reflux.

CT ABDOMEN AND PELVIS IMPRESSION

1. No abdominopelvic soft tissue metastasis.
2. Mild progression of osseous metastasis with enlargement of a left
iliac lesion and adjacent soft tissue component.
3.  Possible constipation.
4. Similar right greater the left hydroureteronephrosis without
cause identified. Improvement in right perinephric and pericystic
nonspecific interstitial thickening.
5. Cholelithiasis with porcelain gallbladder. No acute inflammation.
6.  Possible constipation.

## 2017-05-30 DEATH — deceased
